# Patient Record
Sex: Female | Born: 1960 | Race: White | Hispanic: No | Marital: Married | Smoking: Former smoker
Health system: Southern US, Community
[De-identification: ages and names within clinical notes are randomized; demographics above are authoritative.]

## PROBLEM LIST (undated history)

## (undated) DIAGNOSIS — H269 Unspecified cataract: Secondary | ICD-10-CM

## (undated) DIAGNOSIS — T4145XA Adverse effect of unspecified anesthetic, initial encounter: Secondary | ICD-10-CM

## (undated) DIAGNOSIS — R42 Dizziness and giddiness: Secondary | ICD-10-CM

## (undated) DIAGNOSIS — T8859XA Other complications of anesthesia, initial encounter: Secondary | ICD-10-CM

## (undated) DIAGNOSIS — G4730 Sleep apnea, unspecified: Secondary | ICD-10-CM

## (undated) DIAGNOSIS — A389 Scarlet fever, uncomplicated: Secondary | ICD-10-CM

## (undated) DIAGNOSIS — D496 Neoplasm of unspecified behavior of brain: Secondary | ICD-10-CM

## (undated) DIAGNOSIS — Z973 Presence of spectacles and contact lenses: Secondary | ICD-10-CM

## (undated) DIAGNOSIS — J189 Pneumonia, unspecified organism: Secondary | ICD-10-CM

## (undated) DIAGNOSIS — Z9289 Personal history of other medical treatment: Secondary | ICD-10-CM

## (undated) DIAGNOSIS — K229 Disease of esophagus, unspecified: Secondary | ICD-10-CM

## (undated) DIAGNOSIS — M797 Fibromyalgia: Secondary | ICD-10-CM

## (undated) DIAGNOSIS — M1990 Unspecified osteoarthritis, unspecified site: Secondary | ICD-10-CM

## (undated) DIAGNOSIS — R519 Headache, unspecified: Secondary | ICD-10-CM

## (undated) HISTORY — PX: CHOLECYSTECTOMY: SHX55

## (undated) HISTORY — PX: ABDOMINAL HYSTERECTOMY: SHX81

## (undated) HISTORY — PX: BARIATRIC SURGERY: SHX1103

## (undated) HISTORY — PX: TONSILLECTOMY: SUR1361

## (undated) HISTORY — PX: TUBAL LIGATION: SHX77

## (undated) HISTORY — PX: JOINT REPLACEMENT: SHX530

## (undated) HISTORY — PX: APPENDECTOMY: SHX54

---
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
## 2017-02-03 DIAGNOSIS — Z9289 Personal history of other medical treatment: Secondary | ICD-10-CM

## 2017-02-03 HISTORY — DX: Personal history of other medical treatment: Z92.89

## 2019-04-26 ENCOUNTER — Other Ambulatory Visit: Payer: Self-pay | Admitting: Neurosurgery

## 2019-04-26 DIAGNOSIS — C719 Malignant neoplasm of brain, unspecified: Secondary | ICD-10-CM

## 2019-04-28 NOTE — Progress Notes (Addendum)
[facility] [place], [place] - [address] Time [place] [address] Phone: [phone] Fax: [phone]    Your procedure is scheduled on Tuesday, July 28th.  Report to [facility] Main Entrance "A" at 10:45 A.M., and check in at the Admitting office.  Call this number if you have problems the morning of surgery:  [phone]  Call [phone] if you have any questions prior to your surgery date Monday-Friday 8am-4pm   Remember:  Do not eat or drink after midnight the night before your surgery    Take these medicines the morning of surgery with A SIP OF WATER docusate sodium (COLACE) gabapentin (NEURONTIN) levETIRAcetam (KEPPRA)  If needed - HYDROcodone-acetaminophen (NORCO)  7 days prior to surgery STOP taking any Aspirin (unless otherwise instructed by your surgeon), Aleve, Naproxen, Ibuprofen, Motrin, Advil, Goody's, BC's, all herbal medications, fish oil, and all vitamins.   The Morning of Surgery  Do not wear jewelry, make-up or nail polish.  Do not wear lotions, powders, or perfumes/colognes, or deodorant  Do not shave 48 hours prior to surgery.   Do not bring valuables to the hospital.  [facility] is not responsible for any belongings or valuables.  If you are a smoker, DO NOT Smoke 24 hours prior to surgery IF you wear a CPAP at night please bring your mask, tubing, and machine the morning of surgery   Remember that you must have someone to transport you home after your surgery, and remain with you for 24 hours if you are discharged the same day.  Contacts, glasses, hearing aids, dentures or bridgework may not be worn into surgery.   Leave your suitcase in the car.  After surgery it may be brought to your room.  For patients admitted to the hospital, discharge time will be determined by your treatment team.  Patients discharged the day of surgery will not be allowed to drive home.   Special instructions:   [facility]- Preparing For Surgery  Before surgery, you can play an important role. Because skin is not sterile, your skin needs to be as free of germs as possible. You can reduce the number of germs on your skin by washing with CHG (chlorahexidine gluconate) Soap before surgery.  CHG is an antiseptic cleaner which kills germs and bonds with the skin to continue killing germs even after washing.    Oral Hygiene is also important to reduce your risk of infection.  Remember - BRUSH YOUR TEETH THE MORNING OF SURGERY WITH YOUR REGULAR TOOTHPASTE  Please do not use if you have an allergy to CHG or antibacterial soaps. If your skin becomes reddened/irritated stop using the CHG.  Do not shave (including legs and underarms) for at least 48 hours prior to first CHG shower. It is OK to shave your face.  Please follow these instructions carefully.   1. Shower the NIGHT BEFORE SURGERY and the MORNING OF SURGERY with CHG Soap.   2. If you chose to wash your hair, wash your hair first as usual with your normal shampoo.  3. After you shampoo, rinse your hair and body thoroughly to remove the shampoo.  4. Use CHG as you would any other liquid soap. You can apply CHG directly to the skin and wash gently with a scrungie or a clean washcloth.   5. Apply the CHG Soap to your body ONLY FROM THE NECK DOWN.  Do not use on open wounds or open sores. Avoid contact with your  eyes, ears, mouth and genitals (private parts). Wash Face and genitals (private parts)  with your normal soap.   6. Wash thoroughly, paying special attention to the area where your surgery will be performed.  7. Thoroughly rinse your body with warm water from the neck down.  8. DO NOT shower/wash with your normal soap after using and rinsing off the CHG Soap.  9. Pat yourself dry with a CLEAN TOWEL.  10. Wear CLEAN PAJAMAS to bed the night before surgery, wear comfortable clothes the morning of surgery  11. Place CLEAN SHEETS on your bed the  night of your first shower and DO NOT SLEEP WITH PETS.  Day of Surgery:  Do not apply any deodorants/lotions. Please shower the morning of surgery with the CHG soap  Please wear clean clothes to the hospital/surgery center.   Remember to brush your teeth WITH YOUR REGULAR TOOTHPASTE.  Please read over the following fact sheets that you were given.

## 2019-04-29 ENCOUNTER — Other Ambulatory Visit (HOSPITAL_COMMUNITY): Payer: Self-pay | Admitting: Neurosurgery

## 2019-04-29 ENCOUNTER — Observation Stay (HOSPITAL_COMMUNITY)
Admission: EM | Admit: 2019-04-29 | Discharge: 2019-04-30 | Disposition: A | Discharge status: Home / Self Care | Payer: Managed Care, Other (non HMO) | Attending: Neurosurgery | Admitting: Neurosurgery

## 2019-04-29 ENCOUNTER — Encounter (HOSPITAL_COMMUNITY): Payer: Self-pay | Admitting: Emergency Medicine

## 2019-04-29 ENCOUNTER — Encounter (HOSPITAL_COMMUNITY): Payer: Self-pay

## 2019-04-29 ENCOUNTER — Inpatient Hospital Stay (HOSPITAL_COMMUNITY): Admission: RE | Admit: 2019-04-29 | Payer: Self-pay | Source: Ambulatory Visit

## 2019-04-29 ENCOUNTER — Emergency Department (HOSPITAL_COMMUNITY): Payer: Managed Care, Other (non HMO)

## 2019-04-29 ENCOUNTER — Other Ambulatory Visit: Payer: Self-pay

## 2019-04-29 ENCOUNTER — Ambulatory Visit (HOSPITAL_COMMUNITY)
Admission: RE | Admit: 2019-04-29 | Discharge: 2019-04-29 | Disposition: A | Discharge status: Home / Self Care | Payer: Managed Care, Other (non HMO) | Source: Ambulatory Visit | Attending: Neurosurgery | Admitting: Neurosurgery

## 2019-04-29 ENCOUNTER — Inpatient Hospital Stay (HOSPITAL_COMMUNITY)
Admission: RE | Admit: 2019-04-29 | Discharge: 2019-04-29 | Disposition: A | Discharge status: Home / Self Care | Payer: Self-pay | Source: Ambulatory Visit

## 2019-04-29 DIAGNOSIS — Z79899 Other long term (current) drug therapy: Secondary | ICD-10-CM | POA: Insufficient documentation

## 2019-04-29 DIAGNOSIS — C719 Malignant neoplasm of brain, unspecified: Secondary | ICD-10-CM | POA: Diagnosis not present

## 2019-04-29 DIAGNOSIS — G9389 Other specified disorders of brain: Secondary | ICD-10-CM

## 2019-04-29 DIAGNOSIS — R51 Headache: Secondary | ICD-10-CM | POA: Insufficient documentation

## 2019-04-29 DIAGNOSIS — I10 Essential (primary) hypertension: Secondary | ICD-10-CM | POA: Insufficient documentation

## 2019-04-29 DIAGNOSIS — R42 Dizziness and giddiness: Secondary | ICD-10-CM | POA: Insufficient documentation

## 2019-04-29 DIAGNOSIS — Z888 Allergy status to other drugs, medicaments and biological substances status: Secondary | ICD-10-CM | POA: Insufficient documentation

## 2019-04-29 DIAGNOSIS — R519 Headache, unspecified: Secondary | ICD-10-CM

## 2019-04-29 DIAGNOSIS — R112 Nausea with vomiting, unspecified: Secondary | ICD-10-CM | POA: Insufficient documentation

## 2019-04-29 DIAGNOSIS — Z1159 Encounter for screening for other viral diseases: Secondary | ICD-10-CM | POA: Insufficient documentation

## 2019-04-29 HISTORY — DX: Neoplasm of unspecified behavior of brain: D49.6

## 2019-04-29 LAB — SARS CORONAVIRUS 2 BY RT PCR (HOSPITAL ORDER, PERFORMED IN ~~LOC~~ HOSPITAL LAB): SARS Coronavirus 2: NEGATIVE

## 2019-04-29 LAB — CBC
HCT: 44 % (ref 36.0–46.0)
Hemoglobin: 14.4 g/dL (ref 12.0–15.0)
MCH: 31.3 pg (ref 26.0–34.0)
MCHC: 32.7 g/dL (ref 30.0–36.0)
MCV: 95.7 fL (ref 80.0–100.0)
Platelets: 311 10*3/uL (ref 150–400)
RBC: 4.6 MIL/uL (ref 3.87–5.11)
RDW: 12 % (ref 11.5–15.5)
WBC: 9.8 10*3/uL (ref 4.0–10.5)
nRBC: 0 % (ref 0.0–0.2)

## 2019-04-29 LAB — BASIC METABOLIC PANEL
Anion gap: 13 (ref 5–15)
BUN: 17 mg/dL (ref 6–20)
CO2: 27 mmol/L (ref 22–32)
Calcium: 9.5 mg/dL (ref 8.9–10.3)
Chloride: 102 mmol/L (ref 98–111)
Creatinine, Ser: 0.71 mg/dL (ref 0.44–1.00)
GFR calc Af Amer: >60 mL/min (ref >60)
GFR calc non Af Amer: >60 mL/min (ref >60)
Glucose, Bld: 113 mg/dL — ABNORMAL HIGH (ref 70–99)
Potassium: 4.2 mmol/L (ref 3.5–5.1)
Sodium: 142 mmol/L (ref 135–145)

## 2019-04-29 MED ORDER — LEVETIRACETAM 500 MG PO TABS
500.0000 mg | ORAL_TABLET | Freq: Two times a day (BID) | ORAL | Status: DC
  Administered 2019-04-30: 500 mg via ORAL
  Filled 2019-04-29 (×2): qty 1

## 2019-04-29 MED ORDER — FAMOTIDINE IN NACL 20-0.9 MG/50ML-% IV SOLN
20.0000 mg | Freq: Once | INTRAVENOUS | Status: AC
  Administered 2019-04-29: 20 mg via INTRAVENOUS
  Filled 2019-04-29: qty 50

## 2019-04-29 MED ORDER — METOCLOPRAMIDE HCL 5 MG/ML IJ SOLN
10.0000 mg | Freq: Once | INTRAMUSCULAR | Status: AC
  Administered 2019-04-29: 10 mg via INTRAVENOUS
  Filled 2019-04-29: qty 2

## 2019-04-29 MED ORDER — OXYBUTYNIN CHLORIDE ER 10 MG PO TB24
10.0000 mg | ORAL_TABLET | Freq: Every day | ORAL | Status: DC
  Filled 2019-04-29: qty 1

## 2019-04-29 MED ORDER — HYDROMORPHONE HCL 1 MG/ML IJ SOLN: 0.5000 mg | INTRAMUSCULAR | Status: DC | PRN

## 2019-04-29 MED ORDER — LEVETIRACETAM IN NACL 500 MG/100ML IV SOLN: 500.0000 mg | Freq: Two times a day (BID) | INTRAVENOUS | Status: DC

## 2019-04-29 MED ORDER — DOCUSATE SODIUM 100 MG PO CAPS
100.0000 mg | ORAL_CAPSULE | Freq: Two times a day (BID) | ORAL | Status: DC
  Administered 2019-04-29: 100 mg via ORAL
  Filled 2019-04-29 (×2): qty 1

## 2019-04-29 MED ORDER — DEXAMETHASONE SODIUM PHOSPHATE 10 MG/ML IJ SOLN
10.0000 mg | Freq: Four times a day (QID) | INTRAMUSCULAR | Status: DC
  Administered 2019-04-30 (×2): 10 mg via INTRAVENOUS
  Filled 2019-04-29 (×3): qty 1

## 2019-04-29 MED ORDER — DEXAMETHASONE 2 MG PO TABS
2.0000 mg | ORAL_TABLET | Freq: Two times a day (BID) | ORAL | Status: DC
  Administered 2019-04-30: 2 mg via ORAL
  Filled 2019-04-29 (×2): qty 1

## 2019-04-29 MED ORDER — GABAPENTIN 300 MG PO CAPS: 300.0000 mg | ORAL_CAPSULE | ORAL | Status: DC

## 2019-04-29 MED ORDER — OXYCODONE HCL 5 MG PO TABS: 5.0000 mg | ORAL_TABLET | Freq: Four times a day (QID) | ORAL | Status: DC | PRN

## 2019-04-29 MED ORDER — PANTOPRAZOLE SODIUM 40 MG IV SOLR
40.0000 mg | Freq: Every day | INTRAVENOUS | Status: DC
  Administered 2019-04-29: 40 mg via INTRAVENOUS
  Filled 2019-04-29: qty 40

## 2019-04-29 MED ORDER — ACETAMINOPHEN 325 MG PO TABS: 650.0000 mg | ORAL_TABLET | ORAL | Status: DC | PRN

## 2019-04-29 MED ORDER — KETOROLAC TROMETHAMINE 15 MG/ML IJ SOLN: 15.0000 mg | Freq: Once | INTRAMUSCULAR | Status: DC

## 2019-04-29 MED ORDER — LEVETIRACETAM 500 MG PO TABS: 500.0000 mg | ORAL_TABLET | Freq: Two times a day (BID) | ORAL | Status: DC

## 2019-04-29 MED ORDER — LEVETIRACETAM IN NACL 500 MG/100ML IV SOLN
500.0000 mg | Freq: Two times a day (BID) | INTRAVENOUS | Status: DC
  Administered 2019-04-30: 500 mg via INTRAVENOUS
  Filled 2019-04-29: qty 100

## 2019-04-29 MED ORDER — ONDANSETRON HCL 4 MG/2ML IJ SOLN: 4.0000 mg | INTRAMUSCULAR | Status: DC | PRN

## 2019-04-29 MED ORDER — BARIATRIC FUSION PO CHEW: CHEWABLE_TABLET | Freq: Two times a day (BID) | ORAL | Status: DC

## 2019-04-29 MED ORDER — ACETAMINOPHEN 650 MG RE SUPP: 650.0000 mg | RECTAL | Status: DC | PRN

## 2019-04-29 MED ORDER — PROMETHAZINE HCL 25 MG PO TABS: 12.5000 mg | ORAL_TABLET | ORAL | Status: DC | PRN

## 2019-04-29 MED ORDER — DEXAMETHASONE SODIUM PHOSPHATE 10 MG/ML IJ SOLN
10.0000 mg | Freq: Once | INTRAMUSCULAR | Status: AC
  Administered 2019-04-29: 10 mg via INTRAVENOUS
  Filled 2019-04-29: qty 1

## 2019-04-29 MED ORDER — LABETALOL HCL 5 MG/ML IV SOLN: 10.0000 mg | INTRAVENOUS | Status: DC | PRN

## 2019-04-29 MED ORDER — ONDANSETRON HCL 4 MG PO TABS
4.0000 mg | ORAL_TABLET | ORAL | Status: DC | PRN
  Administered 2019-04-29: 4 mg via ORAL
  Filled 2019-04-29: qty 1

## 2019-04-29 MED ORDER — HYDROCODONE-ACETAMINOPHEN 7.5-325 MG PO TABS
1.0000 | ORAL_TABLET | Freq: Four times a day (QID) | ORAL | Status: DC | PRN
  Administered 2019-04-30: 1 via ORAL
  Filled 2019-04-29 (×2): qty 1

## 2019-04-29 MED ORDER — GADOBUTROL 1 MMOL/ML IV SOLN
8.0000 mL | Freq: Once | INTRAVENOUS | Status: AC | PRN
  Administered 2019-04-29: 8 mL via INTRAVENOUS

## 2019-04-29 MED ORDER — MORPHINE SULFATE (PF) 4 MG/ML IV SOLN
4.0000 mg | Freq: Once | INTRAVENOUS | Status: AC
  Administered 2019-04-29: 4 mg via INTRAVENOUS
  Filled 2019-04-29: qty 1

## 2019-04-29 MED ORDER — DIPHENHYDRAMINE HCL 50 MG/ML IJ SOLN
12.5000 mg | Freq: Once | INTRAMUSCULAR | Status: AC
  Administered 2019-04-29: 12.5 mg via INTRAVENOUS
  Filled 2019-04-29: qty 1

## 2019-04-29 NOTE — ED Notes (Signed)
ED TO INPATIENT HANDOFF REPORT  ED Nurse Name and Phone #: [phone]  S Name/Age/Gender Vanessa Whitaker 58 y.o. female Room/Bed: 014C/014C  Code Status   Code Status: Full Code  Home/SNF/Other Home Patient oriented to: self, place, time and situation Is this baseline? Yes   Triage Complete: Triage complete  Chief Complaint Nausea, brain tumor  Triage Note PT. STATED, I HAVE A BRAIN TUMOR AND IM SCHEDULED FOR A mri AND IM SO NAUSEATED AND DIZZY.  PT. STATED, IM HAVING REALLY BAD HEAD PAIN.   Allergies Allergies  Allergen Reactions  . Tetanus Toxoids Swelling and Other (See Comments)    Redness and fever @ the site, also    Level of Care/Admitting Diagnosis ED Disposition    ED Disposition Condition [facility] Area: [place] [[id number]]  Level of Care: Progressive [102]  Covid Evaluation: Asymptomatic Screening Protocol (No Symptoms)  Diagnosis: Brain tumor, glioma [facility]) [[id number]]  Admitting Physician: Roxana Hires  Attending Physician: Kary Kos [[id number]]  Estimated length of stay: past midnight tomorrow  Certification:: I certify there are rare and unusual circumstances requiring inpatient admission  Bed request comments: 4NP  PT Class (Do Not Modify): Inpatient [101]  PT Acc Code (Do Not Modify): Private [1]       B Medical/Surgery History Past Medical History:  Diagnosis Date  . Brain tumor [facility])    History reviewed. No pertinent surgical history.   A IV Location/Drains/Wounds Patient Lines/Drains/Airways Status   Active Line/Drains/Airways    Name:   Placement date:   Placement time:   Site:   Days:   Peripheral IV 04/29/19 Right Antecubital   04/29/19    1621    Antecubital   less than 1          Intake/Output Last 24 hours No intake or output data in the 24 hours ending 04/29/19 2318  Labs/Imaging Results for orders placed or performed during the hospital encounter of 04/29/19 (from the past 48 hour(s))   CBC     Status: None   Collection Time: 04/29/19  1:05 PM  Result Value Ref Range   WBC 9.8 4.0 - 10.5 K/uL   RBC 4.60 3.87 - 5.11 MIL/uL   Hemoglobin 14.4 12.0 - 15.0 g/dL   HCT 44.0 36.0 - 46.0 %   MCV 95.7 80.0 - 100.0 fL   MCH 31.3 26.0 - 34.0 pg   MCHC 32.7 30.0 - 36.0 g/dL   RDW 12.0 11.5 - 15.5 %   Platelets 311 150 - 400 K/uL   nRBC 0.0 0.0 - 0.2 %    Comment: Performed at [facility], [place] [address], [place], [place] [address]  Basic metabolic panel     Status: Abnormal   Collection Time: 04/29/19  1:05 PM  Result Value Ref Range   Sodium 142 135 - 145 mmol/L   Potassium 4.2 3.5 - 5.1 mmol/L   Chloride 102 98 - 111 mmol/L   CO2 27 22 - 32 mmol/L   Glucose, Bld 113 (H) 70 - 99 mg/dL   BUN 17 6 - 20 mg/dL   Creatinine, Ser 0.71 0.44 - 1.00 mg/dL   Calcium 9.5 8.9 - 10.3 mg/dL   GFR calc non Af Amer >60 >60 mL/min   GFR calc Af Amer >60 >60 mL/min   Anion gap 13 5 - 15    Comment: Performed at [facility], [place] [address], [place], [place] [address]  Ct Head Wo Contrast  Result Date: 04/29/2019 CLINICAL DATA:  History of brain tumor. Headache acute and severe. Dizziness and nausea. EXAM: CT HEAD WITHOUT CONTRAST TECHNIQUE: Contiguous axial images were obtained from the base of the skull through the vertex without intravenous contrast. COMPARISON:  None. FINDINGS: Brain: Hypoattenuating mass lies in the right frontal lobe measuring approximately 4.7 x 4.2 x 4.1 cm. There is surrounding vasogenic edema and significant mass effect. Mass effect mostly effaces the right lateral and third ventricles and leads to left midline shift of 13 mm. Overlying sulci are effaced. There are no other masses. No evidence of an ischemic infarct. No extra-axial masses or abnormal fluid collections. There is no intracranial hemorrhage. Vascular: No hyperdense vessel or unexpected calcification. Skull: Normal. Negative for fracture or focal lesion. Sinuses/Orbits: Globes and orbits are  unremarkable. Visualized sinuses and mastoid air cells are clear. Other: None IMPRESSION: 1. 4.7 cm right frontal lobe mass with significant associated vasogenic edema and mass effect. 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 2. No evidence of an acute infarct and no intracranial hemorrhage. Electronically Signed   By: Lajean Manes M.D.   On: 04/29/2019 14:50   Mr Jeri Cos ZH Contrast  Result Date: 04/29/2019 CLINICAL DATA:  Right frontal lobe mass. EXAM: MRI HEAD WITHOUT AND WITH CONTRAST TECHNIQUE: Multiplanar, multiecho pulse sequences of the brain and surrounding structures were obtained without and with intravenous contrast. CONTRAST:  8 mL Gadavist COMPARISON:  MRI of the brain without and with contrast at [facility] and CT head without contrast at [facility] 04/29/2019 FINDINGS: Brain: To a peripherally enhancing mass lesion in the right frontal lobe measures 5.8 x 4.5 x 4.2 cm. This results in significant midline shift of up to 12 mm. Diffusion tensor imaging demonstrates medial displacement of white matter tracts. There is peripheral restricted diffusion consistent with tumor. Extensive surrounding vasogenic edema extends through the right external capsule, anterior internal capsule, and throughout the frontal operculum. There is no enhancement of the ventricles. No other discrete areas of enhancement are present. Brainstem and cerebellum are otherwise unremarkable. The right lateral ventricle is effaced. There is susceptibility about the periphery of the lesion. This may be vascular. No acute infarct or hemorrhage is present. Vascular: Flow is present in the major intracranial arteries. Skull and upper cervical spine: The craniocervical junction is normal. Upper cervical spine is within normal limits. Marrow signal is unremarkable. Sinuses/Orbits: The paranasal sinuses and mastoid air cells are clear. The globes and orbits are within normal limits. IMPRESSION: 1. Peripherally  enhancing mass lesion in the anterior right frontal lobe is most consistent with a primary glioma. Metastatic disease is considered less likely. High-grade tumor, GBM is not excluded. 2. Significant mass effect with midline shift measuring up to 12 mm. 3. Face min the right lateral ventricle. 4. Extensive white matter disease extending through the frontal operculum and into the right temporal lobe. Electronically Signed   By: San Morelle M.D.   On: 04/29/2019 18:11    Pending Labs Unresulted Labs (From admission, onward)    Start     Ordered   04/29/19 2141  SARS Coronavirus 2 [facility] order, Performed in [facility] hospital lab)  Once,   R     04/29/19 2141          Vitals/Pain Today's Vitals   04/29/19 2115 04/29/19 2130 04/29/19 2200 04/29/19 2215  BP: 129/69 133/65 128/62 127/68  Pulse: 70 71 80 74  Resp:  Temp:      TempSrc:      SpO2: 95% 96% 96% 95%  Weight:      Height:      PainSc:  4       Isolation Precautions No active isolations  Medications Medications  gabapentin (NEURONTIN) capsule 300-600 mg (has no administration in time range)  HYDROcodone-acetaminophen (NORCO) 7.5-325 MG per tablet 1 tablet (has no administration in time range)  oxybutynin (DITROPAN-XL) 24 hr tablet 10 mg (has no administration in time range)  docusate sodium (COLACE) capsule 100 mg (has no administration in time range)  Bariatric Fusion CHEW (has no administration in time range)  dexamethasone (DECADRON) tablet 2 mg (has no administration in time range)  ondansetron (ZOFRAN) tablet 4 mg (has no administration in time range)    Or  ondansetron (ZOFRAN) injection 4 mg (has no administration in time range)  promethazine (PHENERGAN) tablet 12.5-25 mg (has no administration in time range)  labetalol (NORMODYNE) injection 10-40 mg (has no administration in time range)  pantoprazole (PROTONIX) injection 40 mg (has no administration in time range)  acetaminophen (TYLENOL) tablet  650 mg (has no administration in time range)    Or  acetaminophen (TYLENOL) suppository 650 mg (has no administration in time range)  HYDROmorphone (DILAUDID) injection 0.5-1 mg (has no administration in time range)  oxyCODONE (Oxy IR/ROXICODONE) immediate release tablet 5 mg (has no administration in time range)  dexamethasone (DECADRON) injection 10 mg (has no administration in time range)  levETIRAcetam (KEPPRA) tablet 500 mg (has no administration in time range)    Or  levETIRAcetam (KEPPRA) IVPB 500 mg/100 mL premix (has no administration in time range)  metoCLOPramide (REGLAN) injection 10 mg (10 mg Intravenous Given 04/29/19 1316)  diphenhydrAMINE (BENADRYL) injection 12.5 mg (12.5 mg Intravenous Given 04/29/19 1316)  morphine 4 MG/ML injection 4 mg (4 mg Intravenous Given 04/29/19 1351)  dexamethasone (DECADRON) injection 10 mg (10 mg Intravenous Given 04/29/19 1818)  famotidine (PEPCID) IVPB 20 mg premix (0 mg Intravenous Stopped 04/29/19 2111)  gadobutrol (GADAVIST) 1 MMOL/ML injection 8 mL (8 mLs Intravenous Contrast Given 04/29/19 1756)    Mobility walks Low fall risk   Focused Assessments    R Recommendations: See Admitting Provider Note  Report given to:   Additional Notes:

## 2019-04-29 NOTE — ED Triage Notes (Signed)
PT. STATED, I HAVE A BRAIN TUMOR AND IM SCHEDULED FOR A mri AND IM SO NAUSEATED AND DIZZY.

## 2019-04-29 NOTE — ED Notes (Signed)
Patient transported to MRI 

## 2019-04-29 NOTE — H&P (Signed)
Vanessa Whitaker is an 58 y.o. female.   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  Over the last few days she has been feeling fine just a little disoriented.  She intermittently has weakness in the left side of her face  Past Medical History:  Diagnosis Date  . Brain tumor [facility])     History reviewed. No pertinent surgical history.  No family history on file. Social History:  reports that she has never smoked. She has never used smokeless tobacco. She reports previous alcohol use. She reports previous drug use.  Allergies:  Allergies  Allergen Reactions  . Tetanus Toxoids Swelling and Other (See Comments)    Swelling and redness to the site, fever    (Not in a hospital admission)   Results for orders placed or performed during the hospital encounter of 04/29/19 (from the past 48 hour(s))  CBC     Status: None   Collection Time: 04/29/19  1:05 PM  Result Value Ref Range   WBC 9.8 4.0 - 10.5 K/uL   RBC 4.60 3.87 - 5.11 MIL/uL   Hemoglobin 14.4 12.0 - 15.0 g/dL   HCT 44.0 36.0 - 46.0 %   MCV 95.7 80.0 - 100.0 fL   MCH 31.3 26.0 - 34.0 pg   MCHC 32.7 30.0 - 36.0 g/dL   RDW 12.0 11.5 - 15.5 %   Platelets 311 150 - 400 K/uL   nRBC 0.0 0.0 - 0.2 %    Comment: Performed at [facility], [place] [address], [place], [place] [address]  Basic metabolic panel     Status: Abnormal   Collection Time: 04/29/19  1:05 PM  Result Value Ref Range   Sodium 142 135 - 145 mmol/L   Potassium 4.2 3.5 - 5.1 mmol/L   Chloride 102 98 - 111 mmol/L   CO2 27 22 - 32 mmol/L   Glucose, Bld 113 (H) 70 - 99 mg/dL   BUN 17 6 - 20  mg/dL   Creatinine, Ser 0.71 0.44 - 1.00 mg/dL   Calcium 9.5 8.9 - 10.3 mg/dL   GFR calc non Af Amer >60 >60 mL/min   GFR calc Af Amer >60 >60 mL/min   Anion gap 13 5 - 15    Comment: Performed at [facility], [place] [address] [place], [place] [address]   Ct Head Wo Contrast  Result Date: 04/29/2019 CLINICAL DATA:  History of brain tumor. Headache acute and severe. Dizziness and nausea. EXAM: CT HEAD WITHOUT CONTRAST TECHNIQUE: Contiguous axial images were obtained from the base of the skull through the vertex without intravenous contrast. COMPARISON:  None. FINDINGS: Brain: Hypoattenuating mass lies in the right frontal lobe measuring approximately 4.7 x 4.2 x 4.1 cm. There is surrounding vasogenic edema and significant mass effect. Mass effect mostly effaces the right lateral and third ventricles and leads to left midline shift of 13 mm. Overlying sulci are effaced. There are no other masses. No evidence of an ischemic infarct. No extra-axial masses or abnormal fluid collections. There is no intracranial hemorrhage. Vascular: No hyperdense vessel or unexpected calcification. Skull: Normal. Negative for fracture or focal lesion. Sinuses/Orbits:  Globes and orbits are unremarkable. Visualized sinuses and mastoid air cells are clear. Other: None IMPRESSION: 1. 4.7 cm right frontal lobe mass with significant associated vasogenic edema and mass effect. 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 2. No evidence of an acute infarct and no intracranial hemorrhage. Electronically Signed   By: Lajean Manes M.D.   On: 04/29/2019 14:50    Review of Systems  Gastrointestinal: Positive for nausea and vomiting.  Neurological: Positive for dizziness, focal weakness and headaches.    Blood pressure 127/66, pulse 69, temperature 98.7 F (37.1 C), temperature source Oral, resp. rate 13, height 5' 6.75" (1.695 m), weight 83 kg, SpO2 98 %. Physical Exam  Constitutional: She is oriented to person, place, and  time. She appears well-developed and well-nourished.  HENT:  Head: Normocephalic.  Eyes: Pupils are equal, round, and reactive to light.  Neck: Normal range of motion.  Respiratory: Effort normal.  GI: Soft.  Musculoskeletal: Normal range of motion.  Neurological: She is alert and oriented to person, place, and time. She has normal strength. GCS eye subscore is 4. GCS verbal subscore is 5. GCS motor subscore is 6.  Awake and alert pupils are equal slight partial central 7th nerve palsy on the left strength 5 out of 5 no pronator drift  Skin: Skin is warm and dry.     Assessment/Plan 58 year old female with a known right frontal glioma awaiting craniotomy presents this morning with headaches nausea vomiting ER recommended admission for high-dose IV Decadron and preparation for surgery on Tuesday.  Gavynn Duvall P, MD 04/29/2019, 4:40 PM

## 2019-04-29 NOTE — Discharge Instructions (Addendum)
You have been seen today for headache. Please read and follow all provided instructions.   1. Medications: usual home medications 2. Treatment: rest, drink plenty of fluids 3. Follow Up: Please call and follow up with your neurosurgeon. Please follow up with your primary doctor in 2 days for discussion of your diagnoses and further evaluation after today's visit; if you do not have a primary care doctor use the resource guide provided to find one; Please return to the ER for any new or worsening symptoms. Please obtain all of your results from medical records or have your doctors office obtain the results - share them with your doctor - you should be seen at your doctors office. Call today to arrange your follow up.   Take medications as prescribed. Please review all of the medicines and only take them if you do not have an allergy to them. Return to the emergency room for worsening condition or new concerning symptoms. Follow up with your regular doctor. If you don't have a regular doctor use one of the numbers below to establish a primary care doctor.  Please be aware that if you are taking birth control pills, taking other prescriptions, ESPECIALLY ANTIBIOTICS may make the birth control ineffective - if this is the case, either do not engage in sexual activity or use alternative methods of birth control such as condoms until you have finished the medicine and your family doctor says it is OK to restart them. If you are on a blood thinner such as COUMADIN, be aware that any other medicine that you take may cause the coumadin to either work too much, or not enough - you should have your coumadin level rechecked in next 7 days if this is the case.  ?  It is also a possibility that you have an allergic reaction to any of the medicines that you have been prescribed - Everybody reacts differently to medications and while MOST people have no trouble with most medicines, you may have a reaction such as nausea,  vomiting, rash, swelling, shortness of breath. If this is the case, please stop taking the medicine immediately and contact your physician.  ?  You should return to the ER if you develop severe or worsening symptoms.   Emergency Department Resource Guide 1) Find a Doctor and Pay Out of Pocket Although you won't have to find out who is covered by your insurance plan, it is a good idea to ask around and get recommendations. You will then need to call the office and see if the doctor you have chosen will accept you as a new patient and what types of options they offer for patients who are self-pay. Some doctors offer discounts or will set up payment plans for their patients who do not have insurance, but you will need to ask so you aren't surprised when you get to your appointment.  2) Contact Your Local Health Department Not all health departments have doctors that can see patients for sick visits, but many do, so it is worth a call to see if yours does. If you don't know where your local health department is, you can check in your phone book. The CDC also has a tool to help you locate your state's health department, and many state websites also have listings of all of their local health departments.  3) Find a [facility] If your illness is not likely to be very severe or complicated, you may want to try a walk in  clinic. These are popping up all over the country in pharmacies, drugstores, and shopping centers. They're usually staffed by nurse practitioners or physician assistants that have been trained to treat common illnesses and complaints. They're usually fairly quick and inexpensive. However, if you have serious medical issues or chronic medical problems, these are probably not your best option.  No Primary Care Doctor: Call Health Connect at  [phone] - they can help you locate a primary care doctor that  accepts your insurance, provides certain services, etc. Physician Referral Service-  [phone]  Chronic Pain Problems: Organization         Address  Phone   Notes  [facility]  [phone] Patients need to be referred by their primary care doctor.   Medication Assistance: Organization         Address  Phone   Notes  [facility] Medication [facility] [address] [place], [place] [address] [phone] --Must be a resident of [facility] -- Must have NO insurance coverage whatsoever (no Medicaid/ Medicare, etc.) -- The pt. MUST have a primary care doctor that directs their care regularly and follows them in the community   [facility]  [phone]   [employer]  [phone]    Agencies that provide inexpensive medical care: Organization         Address  Phone   Notes  [place]  [phone]   [facility]    [phone]   [facility] [place], [place] [address] [phone]   [place] [address], [place] [phone]   [facility]    [phone]   [facility]    [phone]   [place] and [place] [address], [place] Phone:  [phone], Fax:  [phone] Hours of Operation:  9 am - 6 pm, M-F.  Also accepts Medicaid/Medicare and self-pay.  [facility] for [address], [address], [place] Phone: [phone], Fax: [phone]. Hours of Operation:  8:30 am - 5:30 pm, M-F.  Also accepts Medicaid and self-pay.  [facility] [address] [place] Phone: [phone]   [place], [place], [place] [phone], [address]. [phone] Mondays & Thursdays: 7-9 AM.  First 15 patients are seen on a first come, first serve basis.    [place] Providers:  Organization         Address  Phone   Notes  [facility] [address],   [phone] Also accepts self-pay patients.  [facility] [address], [place]  [phone]   [place], [address], [place] [phone]   [facility] [address] [place] [phone]   Lucianne Lei [address], [place]   [phone] Only accepts [place] [facility] [place] patients after they have their name applied to their card.   Self-Pay (no insurance) in [facility]:  Organization         Address  Phone   Notes  Sickle Cell Patients, [facility] [place] [phone]   [facility] [place] [phone]   [facility] [place]  [place], [address], [place] [phone]   [facility]/Dr. Osei-Bonsu  [address] [place] or [address], [place] [phone] Phone number for both [place] and [place] locations is the same.  Urgent Medical and [facility] [address] [place] [phone]   [facility] [address] [place] or [address] [phone]   [facility] [address], [place] [phone], phone; [phone], fax Sees patients 1st and 3rd Saturday of every month.  Must not qualify for public or private insurance (i.e. Medicaid, Medicare, [place] Health Choice, Veterans' Benefits)  Household income should be no more than 200% of the poverty level The clinic cannot treat you if you are pregnant or think you are pregnant  Sexually transmitted diseases are not treated at the clinic.

## 2019-04-29 NOTE — ED Provider Notes (Signed)
[facility] EMERGENCY DEPARTMENT Provider Note   CSN: [id number] Arrival date & time: 04/29/19  1240    History   Chief Complaint Chief Complaint  Patient presents with  . Nausea  . Dizziness  . Headache    HPI Vanessa Whitaker is a 58 y.o. female with a PMH of brain mass, HTN, Migraines, Arthritis, and RLS presenting due to a constant right sided throbbing headache onset this morning at 7am. Patient reports nausea and 3 episodes of vomiting. Patient reports associated dizziness that she describes as the room is spinning. Patient states movement makes symptoms worse and nothing makes symptoms better. Patient denies weakness, numbness, vision changes, neck pain, fever, chills, cough, congestion, shortness of breath, or chest pain. 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 Saintclair Halsted. Patient had a CT head and an MRI on 04/19/2019.      HPI  Past Medical History:  Diagnosis Date  . Brain tumor ([place])     There are no active problems to display for this patient.   History reviewed. No pertinent surgical history.   OB History   No obstetric history on file.      Home Medications    Prior to Admission medications   Medication Sig Start Date End Date Taking? Authorizing Provider  dexamethasone (DECADRON) 2 MG tablet Take 2 mg by mouth 2 (two) times daily with a meal. 04/22/19   [provider]  docusate sodium (COLACE) 100 MG capsule Take 100 mg by mouth 2 (two) times daily.    [provider]  gabapentin (NEURONTIN) 300 MG capsule Take 300-600 mg by mouth See admin instructions. Take 300 mg by mouth in the morning and take 600 mg by mouth at bedtime    [provider]  HYDROcodone-acetaminophen (NORCO) 7.5-325 MG tablet Take 1 tablet by mouth every 6 (six) hours as needed for moderate pain.    [provider]  levETIRAcetam (KEPPRA) 500 MG tablet Take 500 mg by mouth 2 (two)  times daily.    [provider]  Multiple Vitamins-Minerals (BARIATRIC FUSION PO) Take 2 tablets by mouth 2 (two) times a day.    [provider]  oxybutynin (DITROPAN-XL) 10 MG 24 hr tablet Take 10 mg by mouth at bedtime.    [provider]    Family History No family history on file.  Social History Social History   Tobacco Use  . Smoking status: Never Smoker  . Smokeless tobacco: Never Used  Substance Use Topics  . Alcohol use: Not Currently  . Drug use: Not Currently     Allergies   Tetanus toxoids  Review of Systems Review of Systems  Constitutional: Negative for activity change, appetite change, chills, diaphoresis, fatigue, fever and unexpected weight change.  HENT: Negative for congestion, ear pain, sinus pressure, sinus pain and sore throat.   Eyes: Negative for photophobia and visual disturbance.  Respiratory: Negative for shortness of breath.   Cardiovascular: Negative for chest pain.  Gastrointestinal: Negative for abdominal pain, nausea and vomiting.  Musculoskeletal: Negative for gait problem, myalgias, neck pain and neck stiffness.  Skin: Negative for rash.  Allergic/Immunologic: Negative for immunocompromised state.  Neurological: Positive for dizziness and headaches. Negative for tremors, seizures, syncope, facial asymmetry, speech difficulty, weakness, light-headedness and numbness.  Hematological: Does not bruise/bleed easily.  Psychiatric/Behavioral: Negative for confusion.    Physical Exam Updated Vital Signs BP 127/66   Pulse 69  Temp 98.7 F (37.1 C) (Oral)   Resp 13   Ht 5' 6.75" (1.695 m)   Wt 83 kg   LMP  (LMP Unknown)   SpO2 98%   BMI 28.88 kg/m   Physical Exam Vitals signs and nursing note reviewed.  Constitutional:      General: She is not in acute distress.    Appearance: She is well-developed. She is not diaphoretic.  HENT:     Head: Normocephalic and atraumatic.     Mouth/Throat:     Mouth: Mucous  membranes are moist.  Eyes:     General: No scleral icterus.    Extraocular Movements: Extraocular movements intact.     Pupils: Pupils are equal, round, and reactive to light. Pupils are equal.  Neck:     Musculoskeletal: Normal range of motion.  Cardiovascular:     Rate and Rhythm: Normal rate and regular rhythm.     Heart sounds: Normal heart sounds. No murmur. No friction rub. No gallop.   Pulmonary:     Effort: Pulmonary effort is normal. No respiratory distress.     Breath sounds: Normal breath sounds. No wheezing or rales.  Abdominal:     Palpations: Abdomen is soft.     Tenderness: There is no abdominal tenderness.  Musculoskeletal: Normal range of motion.  Skin:    General: Skin is warm.     Findings: No erythema or rash.  Neurological:     Mental Status: She is alert.    Mental Status:  Alert, oriented, thought content appropriate, able to give a coherent history. Speech fluent without evidence of aphasia. Able to follow 2 step commands without difficulty.  Cranial Nerves:  II:  Peripheral visual fields grossly normal, pupils equal, round, reactive to light III,IV, VI: ptosis not present, extra-ocular motions intact bilaterally  V,VII: smile symmetric, facial light touch sensation equal VIII: hearing grossly normal to voice  IX,X: symmetric elevation of soft palate, uvula elevates symmetrically  XI: bilateral shoulder shrug symmetric and strong XII: midline tongue extension without fassiculations Motor:  Normal tone. 5/5 in upper and lower extremities bilaterally including strong and equal grip strength and dorsiflexion/plantar flexion Sensory: light touch normal in all extremities.  Deep Tendon Reflexes: 2+ and symmetric in the biceps and patella Cerebellar: normal finger-to-nose with bilateral upper extremities Gait: normal gait and balance.  CV: distal pulses palpable throughout.   ED Treatments / Results  Labs (all labs ordered are listed, but only abnormal  results are displayed) Labs Reviewed  BASIC METABOLIC PANEL - Abnormal; Notable for the following components:      Result Value   Glucose, Bld 113 (*)    All other components within normal limits  CBC    EKG None  Radiology Ct Head Wo Contrast  Result Date: 04/29/2019 CLINICAL DATA:  History of brain tumor. Headache acute and severe. Dizziness and nausea. EXAM: CT HEAD WITHOUT CONTRAST TECHNIQUE: Contiguous axial images were obtained from the base of the skull through the vertex without intravenous contrast. COMPARISON:  None. FINDINGS: Brain: Hypoattenuating mass lies in the right frontal lobe measuring approximately 4.7 x 4.2 x 4.1 cm. There is surrounding vasogenic edema and significant mass effect. Mass effect mostly effaces the right lateral and third ventricles and leads to left midline shift of 13 mm. Overlying sulci are effaced. There are no other masses. No evidence of an ischemic infarct. No extra-axial masses or abnormal fluid collections. There is no intracranial hemorrhage. Vascular: No hyperdense vessel or unexpected calcification.  Skull: Normal. Negative for fracture or focal lesion. Sinuses/Orbits: Globes and orbits are unremarkable. Visualized sinuses and mastoid air cells are clear. Other: None IMPRESSION: 1. 4.7 cm right frontal lobe mass with significant associated vasogenic edema and mass effect. 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 2. No evidence of an acute infarct and no intracranial hemorrhage. Electronically Signed   By: Lajean Manes M.D.   On: 04/29/2019 14:50   Procedures Procedures (including critical care time)  Medications Ordered in ED Medications  metoCLOPramide (REGLAN) injection 10 mg (10 mg Intravenous Given 04/29/19 1316)  diphenhydrAMINE (BENADRYL) injection 12.5 mg (12.5 mg Intravenous Given 04/29/19 1316)  morphine 4 MG/ML injection 4 mg (4 mg Intravenous Given 04/29/19 1351)    Initial Impression / Assessment and Plan / ED Course  I have reviewed  the triage vital signs and the nursing notes.  Pertinent labs & imaging results that were available during my care of the patient were reviewed by me and considered in my medical decision making (see chart for details).  Clinical Course as of Apr 28 1556  Fri Apr 29, 2019  1454 1. 4.7 cm right frontal lobe mass with significant associated vasogenic edema and mass effect. 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 2. No evidence of an acute infarct and no intracranial hemorrhage.    CT Head Wo Contrast [AH]    Clinical Course User Index [AH] Arville Lime, PA-C      Patient presents with a headache. Pt HA treated and slightly improved while in ED. Patient has a history of a brain mass that will require surgery on 07/28. Pt is afebrile with no focal neuro deficits, nuchal rigidity, or change in vision. CT head reveals a 4.7cm right frontal lobe mass with significant associated vasogenic edema and mass effect. Patient had an appointment scheduled with MRI today while she was in the ER. MRI called and suggested MRI be performed while in the ER to help not delay surgery. MRI ordered. Consulted Dr. Saintclair Halsted for further recommendations. Neurosurgery recommends Decadron and anti acid medications. Dr. Saintclair Halsted states he will evaluate patient in the ER. Neurosurgery's evaluation is pending.   Findings and plan of care discussed with supervising physician Dr. Tomi Bamberger.  At shift change care was transferred to Dr. Sabra Heck who will follow pending studies, re-evaluate and determine disposition.    Final Clinical Impressions(s) / ED Diagnoses   Final diagnoses:  Bad headache  Mass of brain    ED Discharge Orders    None       Arville Lime, [place] 04/29/19 1613    Dorie Rank, MD 04/30/19 [phone]

## 2019-04-29 NOTE — ED Notes (Signed)
Patient placed on bedpan.

## 2019-04-29 NOTE — ED Triage Notes (Signed)
PT. STATED, IM HAVING REALLY BAD HEAD PAIN.

## 2019-04-29 NOTE — ED Notes (Signed)
Pt voided and is now off the bedpan.

## 2019-04-29 NOTE — ED Notes (Signed)
Attempted to call report

## 2019-04-30 LAB — SURGICAL PCR SCREEN
MRSA, PCR: NEGATIVE
Staphylococcus aureus: NEGATIVE

## 2019-04-30 MED ORDER — DEXAMETHASONE 4 MG PO TABS: 4.0000 mg | ORAL_TABLET | Freq: Four times a day (QID) | ORAL | 0 refills | Status: DC

## 2019-04-30 MED ORDER — ENSURE MAX PROTEIN PO LIQD
11.0000 [oz_av] | Freq: Every day | ORAL | Status: DC
  Administered 2019-04-30: 11 [oz_av] via ORAL
  Filled 2019-04-30: qty 330

## 2019-04-30 MED ORDER — PANTOPRAZOLE SODIUM 40 MG PO TBEC: 40.0000 mg | DELAYED_RELEASE_TABLET | Freq: Every day | ORAL | Status: DC

## 2019-04-30 NOTE — Discharge Summary (Signed)
Physician Discharge Summary    Providing Compassionate, Quality Care - Together  Patient ID: Vanessa Whitaker MRN: [id number] DOB/AGE: 58-Nov-1962 58 y.o.  Admit date: 04/29/2019 Discharge date: 04/30/2019  Admission Diagnoses: Brain tumor, glioma  Discharge Diagnoses:  Active Problems:   Brain tumor, glioma [facility])   Discharged Condition: good  Hospital Course: Patient was admitted 04/29/2019 for increased headache, nausea, and vomiting. Patient with known right frontal brain tumor. She was scheduled for surgery 05/03/2019. She was treated with decadron during her admission and reports significant improvement of her symptoms. She would like to discharge home and return for surgery on 05/03/2019. She is medically stable at this time and appropriate for discharge home.  Consults: None  Significant Diagnostic Studies: Ct Head Wo Contrast  Result Date: 04/29/2019 CLINICAL DATA:  History of brain tumor. Headache acute and severe. Dizziness and nausea. EXAM: CT HEAD WITHOUT CONTRAST TECHNIQUE: Contiguous axial images were obtained from the base of the skull through the vertex without intravenous contrast. COMPARISON:  None. FINDINGS: Brain: Hypoattenuating mass lies in the right frontal lobe measuring approximately 4.7 x 4.2 x 4.1 cm. There is surrounding vasogenic edema and significant mass effect. Mass effect mostly effaces the right lateral and third ventricles and leads to left midline shift of 13 mm. Overlying sulci are effaced. There are no other masses. No evidence of an ischemic infarct. No extra-axial masses or abnormal fluid collections. There is no intracranial hemorrhage. Vascular: No hyperdense vessel or unexpected calcification. Skull: Normal. Negative for fracture or focal lesion. Sinuses/Orbits: Globes and orbits are unremarkable. Visualized sinuses and mastoid air cells are clear. Other: None IMPRESSION: 1. 4.7 cm right frontal lobe mass with significant associated vasogenic edema and mass  effect. 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 2. No evidence of an acute infarct and no intracranial hemorrhage. Electronically Signed   By: Lajean Manes M.D.   On: 04/29/2019 14:50   Mr Jeri Cos [place] Contrast  Result Date: 04/29/2019 CLINICAL DATA:  Right frontal lobe mass. EXAM: MRI HEAD WITHOUT AND WITH CONTRAST TECHNIQUE: Multiplanar, multiecho pulse sequences of the brain and surrounding structures were obtained without and with intravenous contrast. CONTRAST:  8 mL Gadavist COMPARISON:  MRI of the brain without and with contrast at [facility] and CT head without [facility] 04/29/2019 FINDINGS: Brain: To a peripherally enhancing mass lesion in the right frontal lobe measures 5.8 x 4.5 x 4.2 cm. This results in significant midline shift of up to 12 mm. Diffusion tensor imaging demonstrates medial displacement of white matter tracts. There is peripheral restricted diffusion consistent with tumor. Extensive surrounding vasogenic edema extends through the right external capsule, anterior internal capsule, and throughout the frontal operculum. There is no enhancement of the ventricles. No other discrete areas of enhancement are present. Brainstem and cerebellum are otherwise unremarkable. The right lateral ventricle is effaced. There is susceptibility about the periphery of the lesion. This may be vascular. No acute infarct or hemorrhage is present. Vascular: Flow is present in the major intracranial arteries. Skull and upper cervical spine: The craniocervical junction is normal. Upper cervical spine is within normal limits. Marrow signal is unremarkable. Sinuses/Orbits: The paranasal sinuses and mastoid air cells are clear. The globes and orbits are within normal limits. IMPRESSION: 1. 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 Metastatic disease is considered less likely. High-grade tumor, GBM is not  excluded. 2. Significant mass effect with midline shift measuring  up to 12 mm. 3. Face min the right lateral ventricle. 4. Extensive white matter disease extending through the frontal operculum and into the right temporal lobe. Electronically Signed   By: San Morelle M.D.   On: 04/29/2019 18:11      Treatments: steroids: decadron  Discharge Exam: Blood pressure 122/80, pulse 65, temperature 98.5 F (36.9 C), temperature source Oral, resp. rate 16, height 5' 6.75" (1.695 m), weight 83 kg, SpO2 95 %.   Alert and oriented x 4 PERRLA Speech fluent MAE, Strength 5/5, Normal ROM No pronator drift Slight 7th nerve palsy on the left   Disposition: Discharge disposition: 01-Home or Self Care        Allergies as of 04/30/2019      Reactions   Tetanus Toxoids Swelling, Other (See Comments)   Redness and fever @ the site, also      Medication List    TAKE these medications   BARIATRIC FUSION PO Take 2 tablets by mouth 2 (two) times a day.   dexamethasone 4 MG tablet Commonly known as: DECADRON Take 1 tablet (4 mg total) by mouth every 6 (six) hours. What changed:   medication strength  how much to take  when to take this   diclofenac sodium 1 % Gel Commonly known as: VOLTAREN Place 2-4 g onto the skin 4 (four) times daily as needed (for pain).   docusate sodium 100 MG capsule Commonly known as: COLACE Take 100 mg by mouth 2 (two) times daily.   gabapentin 300 MG capsule Commonly known as: NEURONTIN Take 300-600 mg by mouth See admin instructions. Take 300 mg by mouth in the morning and 600 mg by mouth at bedtime   HYDROcodone-acetaminophen 7.5-325 MG tablet Commonly known as: NORCO Take 1 tablet by mouth every 6 (six) hours as needed for moderate pain.   levETIRAcetam 500 MG tablet Commonly known as: KEPPRA Take 500 mg by mouth 2 (two) times daily.   meclizine 12.5 MG tablet Commonly known as: ANTIVERT Take 12.5-25 mg by mouth every 6 (six) hours as  needed for dizziness or nausea.   oxybutynin 10 MG 24 hr tablet Commonly known as: DITROPAN-XL Take 10 mg by mouth at bedtime.      Follow-up Information    Kary Kos, MD Follow up.   Specialty: Neurosurgery Why: Surgery scheduled for Tuesday, 05/03/2019 Contact information: [address] [place] [address] [place] [address] [phone]           Signed: Patricia Nettle 04/30/2019, 10:52 AM

## 2019-05-02 ENCOUNTER — Other Ambulatory Visit: Payer: Self-pay

## 2019-05-02 ENCOUNTER — Encounter (HOSPITAL_COMMUNITY): Payer: Self-pay | Admitting: *Deleted

## 2019-05-02 NOTE — Progress Notes (Signed)
Pt denies SOB, chest pain, and being under the care of a cardiologist. Pt stated that her PCP is Lorenso Quarry, NP.  Pt denies having an echo and cardiac cath. Chest x ray and LOV note requested from [facility] Coordinator, made nurse aware that there is no record of a chest x ray being performed or LOV note. Nurse requested stress test and EKG tracing from [facility]; awaiting response. Pt stated that she does not take Aspirin. Pt made aware to stop taking  vitamins, fish oil and herbal medications. Do not take any NSAIDs ie: Ibuprofen, Advil, Naproxen (Aleve), Motrin, Voltaren Gel, BC and Goody Powder. Pt denies that she and family members tested positive for COVID-19 ( pt tested on 04/30/19; pt reminded to quarantine).   Coronavirus Screening  Pt denies that she and family experienced the following symptoms:  Cough yes/no: No Fever (>100.59F)  yes/no: No Runny nose yes/no: No Sore throat yes/no: No Difficulty breathing/shortness of breath  yes/no: No  Have you or a family member traveled in the last 14 days and where? yes/no: No  Pt reminded that hospital visitation restrictions are in effect and the importance of the restrictions.   Pt verbalized understanding of all pre-op instructions.

## 2019-05-03 ENCOUNTER — Encounter (HOSPITAL_COMMUNITY): Payer: Self-pay | Admitting: *Deleted

## 2019-05-03 ENCOUNTER — Inpatient Hospital Stay (HOSPITAL_COMMUNITY): Payer: Managed Care, Other (non HMO) | Admitting: Certified Registered Nurse Anesthetist

## 2019-05-03 ENCOUNTER — Inpatient Hospital Stay (HOSPITAL_COMMUNITY)
Admission: RE | Admit: 2019-05-03 | Discharge: 2019-05-07 | DRG: 027 | Disposition: A | Discharge status: Home / Self Care | Payer: Managed Care, Other (non HMO) | Attending: Neurosurgery | Admitting: Neurosurgery

## 2019-05-03 ENCOUNTER — Encounter (HOSPITAL_COMMUNITY)
Admission: RE | Disposition: A | Discharge status: Home / Self Care | Payer: Self-pay | Source: Home / Self Care | Attending: Neurosurgery

## 2019-05-03 ENCOUNTER — Encounter (HOSPITAL_COMMUNITY): Payer: Self-pay

## 2019-05-03 ENCOUNTER — Other Ambulatory Visit: Payer: Self-pay

## 2019-05-03 ENCOUNTER — Ambulatory Visit (HOSPITAL_COMMUNITY)
Admission: RE | Admit: 2019-05-03 | Discharge: 2019-05-03 | Disposition: A | Discharge status: Home / Self Care | Payer: Managed Care, Other (non HMO) | Source: Ambulatory Visit | Attending: Neurosurgery | Admitting: Neurosurgery

## 2019-05-03 DIAGNOSIS — C719 Malignant neoplasm of brain, unspecified: Secondary | ICD-10-CM

## 2019-05-03 DIAGNOSIS — M797 Fibromyalgia: Secondary | ICD-10-CM | POA: Diagnosis present

## 2019-05-03 DIAGNOSIS — Z8249 Family history of ischemic heart disease and other diseases of the circulatory system: Secondary | ICD-10-CM | POA: Diagnosis not present

## 2019-05-03 DIAGNOSIS — Z823 Family history of stroke: Secondary | ICD-10-CM

## 2019-05-03 DIAGNOSIS — C711 Malignant neoplasm of frontal lobe: Principal | ICD-10-CM | POA: Diagnosis present

## 2019-05-03 DIAGNOSIS — H269 Unspecified cataract: Secondary | ICD-10-CM | POA: Diagnosis present

## 2019-05-03 DIAGNOSIS — G4730 Sleep apnea, unspecified: Secondary | ICD-10-CM | POA: Diagnosis present

## 2019-05-03 DIAGNOSIS — R29810 Facial weakness: Secondary | ICD-10-CM | POA: Diagnosis present

## 2019-05-03 DIAGNOSIS — Z87891 Personal history of nicotine dependence: Secondary | ICD-10-CM

## 2019-05-03 DIAGNOSIS — Z801 Family history of malignant neoplasm of trachea, bronchus and lung: Secondary | ICD-10-CM | POA: Diagnosis not present

## 2019-05-03 DIAGNOSIS — Z79899 Other long term (current) drug therapy: Secondary | ICD-10-CM | POA: Diagnosis not present

## 2019-05-03 DIAGNOSIS — Z96643 Presence of artificial hip joint, bilateral: Secondary | ICD-10-CM | POA: Diagnosis present

## 2019-05-03 DIAGNOSIS — M1990 Unspecified osteoarthritis, unspecified site: Secondary | ICD-10-CM | POA: Diagnosis present

## 2019-05-03 DIAGNOSIS — Z9889 Other specified postprocedural states: Secondary | ICD-10-CM

## 2019-05-03 DIAGNOSIS — Z9884 Bariatric surgery status: Secondary | ICD-10-CM | POA: Diagnosis not present

## 2019-05-03 DIAGNOSIS — Z887 Allergy status to serum and vaccine status: Secondary | ICD-10-CM | POA: Diagnosis not present

## 2019-05-03 HISTORY — DX: Dizziness and giddiness: R42

## 2019-05-03 HISTORY — DX: Scarlet fever, uncomplicated: A38.9

## 2019-05-03 HISTORY — DX: Other complications of anesthesia, initial encounter: T88.59XA

## 2019-05-03 HISTORY — DX: Unspecified osteoarthritis, unspecified site: M19.90

## 2019-05-03 HISTORY — DX: Fibromyalgia: M79.7

## 2019-05-03 HISTORY — DX: Pneumonia, unspecified organism: J18.9

## 2019-05-03 HISTORY — DX: Disease of esophagus, unspecified: K22.9

## 2019-05-03 HISTORY — PX: CRANIOTOMY: SHX93

## 2019-05-03 HISTORY — DX: Headache, unspecified: R51.9

## 2019-05-03 HISTORY — DX: Unspecified cataract: H26.9

## 2019-05-03 HISTORY — DX: Presence of spectacles and contact lenses: Z97.3

## 2019-05-03 HISTORY — DX: Sleep apnea, unspecified: G47.30

## 2019-05-03 HISTORY — PX: APPLICATION OF CRANIAL NAVIGATION: SHX6578

## 2019-05-03 LAB — BASIC METABOLIC PANEL
Anion gap: 10 (ref 5–15)
BUN: 13 mg/dL (ref 6–20)
CO2: 25 mmol/L (ref 22–32)
Calcium: 8.4 mg/dL — ABNORMAL LOW (ref 8.9–10.3)
Chloride: 106 mmol/L (ref 98–111)
Creatinine, Ser: 0.61 mg/dL (ref 0.44–1.00)
GFR calc Af Amer: >60 mL/min (ref >60)
GFR calc non Af Amer: >60 mL/min (ref >60)
Glucose, Bld: 165 mg/dL — ABNORMAL HIGH (ref 70–99)
Potassium: 3.4 mmol/L — ABNORMAL LOW (ref 3.5–5.1)
Sodium: 141 mmol/L (ref 135–145)

## 2019-05-03 LAB — ABO/RH: ABO/RH(D): O POS

## 2019-05-03 LAB — TYPE AND SCREEN
ABO/RH(D): O POS
Antibody Screen: NEGATIVE

## 2019-05-03 LAB — CBC
HCT: 38.3 % (ref 36.0–46.0)
Hemoglobin: 12.6 g/dL (ref 12.0–15.0)
MCH: 31.5 pg (ref 26.0–34.0)
MCHC: 32.9 g/dL (ref 30.0–36.0)
MCV: 95.8 fL (ref 80.0–100.0)
Platelets: 238 10*3/uL (ref 150–400)
RBC: 4 MIL/uL (ref 3.87–5.11)
RDW: 12.2 % (ref 11.5–15.5)
WBC: 8.8 10*3/uL (ref 4.0–10.5)
nRBC: 0 % (ref 0.0–0.2)

## 2019-05-03 LAB — POCT I-STAT 4, (NA,K, GLUC, HGB,HCT)
Glucose, Bld: 121 mg/dL — ABNORMAL HIGH (ref 70–99)
HCT: 37 % (ref 36.0–46.0)
Hemoglobin: 12.6 g/dL (ref 12.0–15.0)
Potassium: 3.7 mmol/L (ref 3.5–5.1)
Sodium: 142 mmol/L (ref 135–145)

## 2019-05-03 LAB — MRSA PCR SCREENING: MRSA by PCR: NEGATIVE

## 2019-05-03 SURGERY — CRANIOTOMY TUMOR EXCISION
Anesthesia: General | Site: Head | Laterality: Right

## 2019-05-03 MED ORDER — ESMOLOL HCL 100 MG/10ML IV SOLN
INTRAVENOUS | Status: AC
  Filled 2019-05-03: qty 20

## 2019-05-03 MED ORDER — SODIUM CHLORIDE 0.9 % IV SOLN
INTRAVENOUS | Status: DC | PRN
  Administered 2019-05-03: 500 mg via INTRAVENOUS

## 2019-05-03 MED ORDER — ONDANSETRON HCL 4 MG/2ML IJ SOLN
INTRAMUSCULAR | Status: DC | PRN
  Administered 2019-05-03: 4 mg via INTRAVENOUS

## 2019-05-03 MED ORDER — DEXAMETHASONE SODIUM PHOSPHATE 10 MG/ML IJ SOLN
10.0000 mg | Freq: Once | INTRAMUSCULAR | Status: AC
  Administered 2019-05-03: 10 mg via INTRAVENOUS
  Filled 2019-05-03: qty 1

## 2019-05-03 MED ORDER — ACETAMINOPHEN 500 MG PO TABS
1000.0000 mg | ORAL_TABLET | Freq: Once | ORAL | Status: AC
  Administered 2019-05-03: 1000 mg via ORAL
  Filled 2019-05-03: qty 2

## 2019-05-03 MED ORDER — LEVETIRACETAM 500 MG PO TABS: 500.0000 mg | ORAL_TABLET | Freq: Two times a day (BID) | ORAL | Status: DC

## 2019-05-03 MED ORDER — LABETALOL HCL 5 MG/ML IV SOLN
INTRAVENOUS | Status: AC
  Filled 2019-05-03: qty 4

## 2019-05-03 MED ORDER — DEXAMETHASONE 4 MG PO TABS
4.0000 mg | ORAL_TABLET | Freq: Four times a day (QID) | ORAL | Status: DC
  Filled 2019-05-03: qty 1

## 2019-05-03 MED ORDER — HYDROMORPHONE HCL 1 MG/ML IJ SOLN
0.5000 mg | INTRAMUSCULAR | Status: DC | PRN
  Administered 2019-05-03: 1 mg via INTRAVENOUS
  Administered 2019-05-03: 0.5 mg via INTRAVENOUS
  Administered 2019-05-04 – 2019-05-06 (×17): 1 mg via INTRAVENOUS
  Filled 2019-05-03 (×18): qty 1

## 2019-05-03 MED ORDER — BACITRACIN ZINC 500 UNIT/GM EX OINT
TOPICAL_OINTMENT | CUTANEOUS | Status: DC | PRN
  Administered 2019-05-03 (×2): 1 via TOPICAL

## 2019-05-03 MED ORDER — SODIUM CHLORIDE 0.9 % IV SOLN
INTRAVENOUS | Status: DC
  Administered 2019-05-03: 14:00:00 via INTRAVENOUS
  Administered 2019-05-03: 1000 mL via INTRAVENOUS
  Administered 2019-05-05: 21:00:00 via INTRAVENOUS

## 2019-05-03 MED ORDER — THROMBIN 5000 UNITS EX SOLR
OROMUCOSAL | Status: DC | PRN
  Administered 2019-05-03: 5 mL via TOPICAL

## 2019-05-03 MED ORDER — HEMOSTATIC AGENTS (NO CHARGE) OPTIME
TOPICAL | Status: DC | PRN
  Administered 2019-05-03 (×2): 1 via TOPICAL

## 2019-05-03 MED ORDER — HYDROMORPHONE HCL 1 MG/ML IJ SOLN
INTRAMUSCULAR | Status: AC
  Filled 2019-05-03: qty 1

## 2019-05-03 MED ORDER — POTASSIUM CHLORIDE IN NACL 20-0.9 MEQ/L-% IV SOLN
INTRAVENOUS | Status: DC
  Administered 2019-05-03 – 2019-05-04 (×2): via INTRAVENOUS
  Filled 2019-05-03 (×3): qty 1000

## 2019-05-03 MED ORDER — ROCURONIUM BROMIDE 10 MG/ML (PF) SYRINGE
PREFILLED_SYRINGE | INTRAVENOUS | Status: DC | PRN
  Administered 2019-05-03: 20 mg via INTRAVENOUS
  Administered 2019-05-03: 50 mg via INTRAVENOUS
  Administered 2019-05-03: 30 mg via INTRAVENOUS
  Administered 2019-05-03: 50 mg via INTRAVENOUS
  Administered 2019-05-03: 20 mg via INTRAVENOUS
  Administered 2019-05-03: 10 mg via INTRAVENOUS
  Administered 2019-05-03 (×2): 20 mg via INTRAVENOUS

## 2019-05-03 MED ORDER — ONDANSETRON HCL 4 MG/2ML IJ SOLN: 4.0000 mg | INTRAMUSCULAR | Status: DC | PRN

## 2019-05-03 MED ORDER — LIDOCAINE 2% (20 MG/ML) 5 ML SYRINGE
INTRAMUSCULAR | Status: DC | PRN
  Administered 2019-05-03: 60 mg via INTRAVENOUS

## 2019-05-03 MED ORDER — CEFAZOLIN SODIUM-DEXTROSE 2-4 GM/100ML-% IV SOLN
INTRAVENOUS | Status: AC
  Filled 2019-05-03: qty 100

## 2019-05-03 MED ORDER — DOCUSATE SODIUM 100 MG PO CAPS: 100.0000 mg | ORAL_CAPSULE | Freq: Two times a day (BID) | ORAL | Status: DC

## 2019-05-03 MED ORDER — OXYCODONE HCL 5 MG PO TABS
ORAL_TABLET | ORAL | Status: AC
  Filled 2019-05-03: qty 1

## 2019-05-03 MED ORDER — 0.9 % SODIUM CHLORIDE (POUR BTL) OPTIME
TOPICAL | Status: DC | PRN
  Administered 2019-05-03 (×3): 1000 mL

## 2019-05-03 MED ORDER — ONDANSETRON HCL 4 MG/2ML IJ SOLN
INTRAMUSCULAR | Status: AC
  Filled 2019-05-03: qty 2

## 2019-05-03 MED ORDER — CHLORHEXIDINE GLUCONATE CLOTH 2 % EX PADS: 6.0000 | MEDICATED_PAD | Freq: Once | CUTANEOUS | Status: DC

## 2019-05-03 MED ORDER — FENTANYL CITRATE (PF) 250 MCG/5ML IJ SOLN
INTRAMUSCULAR | Status: AC
  Filled 2019-05-03: qty 5

## 2019-05-03 MED ORDER — CEFAZOLIN SODIUM-DEXTROSE 2-4 GM/100ML-% IV SOLN
2.0000 g | INTRAVENOUS | Status: AC
  Administered 2019-05-03 (×2): 2 g via INTRAVENOUS
  Filled 2019-05-03: qty 100

## 2019-05-03 MED ORDER — PROMETHAZINE HCL 25 MG PO TABS: 12.5000 mg | ORAL_TABLET | ORAL | Status: DC | PRN

## 2019-05-03 MED ORDER — DEXAMETHASONE SODIUM PHOSPHATE 10 MG/ML IJ SOLN
10.0000 mg | Freq: Four times a day (QID) | INTRAMUSCULAR | Status: DC
  Administered 2019-05-03 – 2019-05-04 (×3): 10 mg via INTRAVENOUS
  Filled 2019-05-03 (×3): qty 1

## 2019-05-03 MED ORDER — ROCURONIUM BROMIDE 10 MG/ML (PF) SYRINGE
PREFILLED_SYRINGE | INTRAVENOUS | Status: AC
  Filled 2019-05-03: qty 10

## 2019-05-03 MED ORDER — DEXAMETHASONE SODIUM PHOSPHATE 10 MG/ML IJ SOLN
INTRAMUSCULAR | Status: AC
  Filled 2019-05-03: qty 1

## 2019-05-03 MED ORDER — SODIUM CHLORIDE 0.9 % IV SOLN
INTRAVENOUS | Status: DC | PRN
  Administered 2019-05-03: 13:00:00 via INTRAVENOUS

## 2019-05-03 MED ORDER — DOCUSATE SODIUM 100 MG PO CAPS
100.0000 mg | ORAL_CAPSULE | Freq: Two times a day (BID) | ORAL | Status: DC
  Administered 2019-05-03 – 2019-05-07 (×8): 100 mg via ORAL
  Filled 2019-05-03 (×8): qty 1

## 2019-05-03 MED ORDER — OXYCODONE HCL 5 MG/5ML PO SOLN: 5.0000 mg | Freq: Once | ORAL | Status: AC | PRN

## 2019-05-03 MED ORDER — GABAPENTIN 300 MG PO CAPS
300.0000 mg | ORAL_CAPSULE | Freq: Every day | ORAL | Status: DC
  Administered 2019-05-04 – 2019-05-07 (×4): 300 mg via ORAL
  Filled 2019-05-03 (×4): qty 1

## 2019-05-03 MED ORDER — THROMBIN 20000 UNITS EX SOLR
CUTANEOUS | Status: AC
  Filled 2019-05-03: qty 20000

## 2019-05-03 MED ORDER — LABETALOL HCL 5 MG/ML IV SOLN
10.0000 mg | INTRAVENOUS | Status: DC | PRN
  Administered 2019-05-03: 10 mg via INTRAVENOUS
  Filled 2019-05-03: qty 8

## 2019-05-03 MED ORDER — LIDOCAINE-EPINEPHRINE 1 %-1:100000 IJ SOLN
INTRAMUSCULAR | Status: DC | PRN
  Administered 2019-05-03: 9 mL

## 2019-05-03 MED ORDER — MIDAZOLAM HCL 2 MG/2ML IJ SOLN
INTRAMUSCULAR | Status: AC
  Filled 2019-05-03: qty 2

## 2019-05-03 MED ORDER — LEVETIRACETAM IN NACL 500 MG/100ML IV SOLN
500.0000 mg | Freq: Two times a day (BID) | INTRAVENOUS | Status: DC
  Administered 2019-05-03 – 2019-05-06 (×6): 500 mg via INTRAVENOUS
  Filled 2019-05-03 (×5): qty 100

## 2019-05-03 MED ORDER — HYDROCODONE-ACETAMINOPHEN 5-325 MG PO TABS
1.0000 | ORAL_TABLET | ORAL | Status: DC | PRN
  Administered 2019-05-03 – 2019-05-06 (×9): 1 via ORAL
  Filled 2019-05-03 (×9): qty 1

## 2019-05-03 MED ORDER — GABAPENTIN 300 MG PO CAPS: 300.0000 mg | ORAL_CAPSULE | ORAL | Status: DC

## 2019-05-03 MED ORDER — OXYBUTYNIN CHLORIDE ER 10 MG PO TB24
10.0000 mg | ORAL_TABLET | Freq: Every day | ORAL | Status: DC
  Administered 2019-05-03 – 2019-05-06 (×4): 10 mg via ORAL
  Filled 2019-05-03 (×7): qty 1

## 2019-05-03 MED ORDER — THROMBIN 5000 UNITS EX SOLR
CUTANEOUS | Status: AC
  Filled 2019-05-03: qty 10000

## 2019-05-03 MED ORDER — THROMBIN 20000 UNITS EX SOLR
CUTANEOUS | Status: DC | PRN
  Administered 2019-05-03: 20 mL via TOPICAL

## 2019-05-03 MED ORDER — LEVETIRACETAM IN NACL 500 MG/100ML IV SOLN
500.0000 mg | INTRAVENOUS | Status: AC
  Filled 2019-05-03 (×2): qty 100

## 2019-05-03 MED ORDER — HYDROMORPHONE HCL 1 MG/ML IJ SOLN
0.2500 mg | INTRAMUSCULAR | Status: DC | PRN
  Administered 2019-05-03 (×4): 0.5 mg via INTRAVENOUS

## 2019-05-03 MED ORDER — HYDROCODONE-ACETAMINOPHEN 7.5-325 MG PO TABS: 1.0000 | ORAL_TABLET | Freq: Four times a day (QID) | ORAL | Status: DC | PRN

## 2019-05-03 MED ORDER — HYDRALAZINE HCL 20 MG/ML IJ SOLN
10.0000 mg | INTRAMUSCULAR | Status: DC | PRN
  Administered 2019-05-03 (×2): 10 mg via INTRAVENOUS
  Administered 2019-05-04 (×2): 20 mg via INTRAVENOUS
  Filled 2019-05-03 (×3): qty 1

## 2019-05-03 MED ORDER — SENNOSIDES-DOCUSATE SODIUM 8.6-50 MG PO TABS: 1.0000 | ORAL_TABLET | Freq: Every evening | ORAL | Status: DC | PRN

## 2019-05-03 MED ORDER — ONDANSETRON HCL 4 MG PO TABS: 4.0000 mg | ORAL_TABLET | ORAL | Status: DC | PRN

## 2019-05-03 MED ORDER — LIDOCAINE-EPINEPHRINE 1 %-1:100000 IJ SOLN
INTRAMUSCULAR | Status: AC
  Filled 2019-05-03: qty 1

## 2019-05-03 MED ORDER — ADULT MULTIVITAMIN W/MINERALS CH
1.0000 | ORAL_TABLET | Freq: Two times a day (BID) | ORAL | Status: DC
  Administered 2019-05-03 – 2019-05-07 (×8): 1 via ORAL
  Filled 2019-05-03 (×8): qty 1

## 2019-05-03 MED ORDER — GABAPENTIN 300 MG PO CAPS
600.0000 mg | ORAL_CAPSULE | Freq: Every day | ORAL | Status: DC
  Administered 2019-05-03 – 2019-05-06 (×4): 600 mg via ORAL
  Filled 2019-05-03 (×4): qty 2

## 2019-05-03 MED ORDER — PROMETHAZINE HCL 25 MG/ML IJ SOLN: 6.2500 mg | INTRAMUSCULAR | Status: DC | PRN

## 2019-05-03 MED ORDER — SODIUM CHLORIDE 0.9 % IV SOLN
INTRAVENOUS | Status: DC | PRN
  Administered 2019-05-03: 20 ug/min via INTRAVENOUS

## 2019-05-03 MED ORDER — MECLIZINE HCL 12.5 MG PO TABS
12.5000 mg | ORAL_TABLET | Freq: Four times a day (QID) | ORAL | Status: DC | PRN
  Filled 2019-05-03: qty 2

## 2019-05-03 MED ORDER — PROPOFOL 10 MG/ML IV BOLUS
INTRAVENOUS | Status: DC | PRN
  Administered 2019-05-03: 50 mg via INTRAVENOUS
  Administered 2019-05-03: 150 mg via INTRAVENOUS

## 2019-05-03 MED ORDER — CEFAZOLIN SODIUM-DEXTROSE 2-4 GM/100ML-% IV SOLN
2.0000 g | Freq: Three times a day (TID) | INTRAVENOUS | Status: AC
  Administered 2019-05-04 (×2): 2 g via INTRAVENOUS
  Filled 2019-05-03 (×2): qty 100

## 2019-05-03 MED ORDER — FENTANYL CITRATE (PF) 100 MCG/2ML IJ SOLN
INTRAMUSCULAR | Status: DC | PRN
  Administered 2019-05-03: 150 ug via INTRAVENOUS
  Administered 2019-05-03 (×2): 50 ug via INTRAVENOUS

## 2019-05-03 MED ORDER — BACITRACIN ZINC 500 UNIT/GM EX OINT
TOPICAL_OINTMENT | CUTANEOUS | Status: AC
  Filled 2019-05-03: qty 28.35

## 2019-05-03 MED ORDER — SODIUM CHLORIDE 0.9 % IV SOLN
INTRAVENOUS | Status: DC | PRN
  Administered 2019-05-03: 500 mL

## 2019-05-03 MED ORDER — PANTOPRAZOLE SODIUM 40 MG IV SOLR
40.0000 mg | Freq: Every day | INTRAVENOUS | Status: DC
  Administered 2019-05-03 – 2019-05-04 (×2): 40 mg via INTRAVENOUS
  Filled 2019-05-03 (×2): qty 40

## 2019-05-03 MED ORDER — SUGAMMADEX SODIUM 500 MG/5ML IV SOLN
INTRAVENOUS | Status: DC | PRN
  Administered 2019-05-03: 200 mg via INTRAVENOUS

## 2019-05-03 MED ORDER — OXYCODONE HCL 5 MG PO TABS
5.0000 mg | ORAL_TABLET | Freq: Once | ORAL | Status: AC | PRN
  Administered 2019-05-03: 5 mg via ORAL

## 2019-05-03 SURGICAL SUPPLY — 85 items
BAG DECANTER FOR FLEXI CONT (MISCELLANEOUS) ×3 IMPLANT
BLADE CLIPPER SURG (BLADE) ×4 IMPLANT
BLADE SURG 11 STRL SS (BLADE) ×3 IMPLANT
BNDG COHESIVE 4X5 TAN NS LF (GAUZE/BANDAGES/DRESSINGS) ×2 IMPLANT
BNDG GAUZE ELAST 4 BULKY (GAUZE/BANDAGES/DRESSINGS) ×1 IMPLANT
BNDG STRETCH 4X75 NS LF (GAUZE/BANDAGES/DRESSINGS) ×1 IMPLANT
BNDG STRETCH 4X75 STRL LF (GAUZE/BANDAGES/DRESSINGS) ×2 IMPLANT
BUR ACORN 9.0 PRECISION (BURR) ×3 IMPLANT
BUR SPIRAL ROUTER 2.3 (BUR) ×1 IMPLANT
CANISTER SUCT 3000ML PPV (MISCELLANEOUS) ×6 IMPLANT
CARTRIDGE OIL MAESTRO DRILL (MISCELLANEOUS) ×2 IMPLANT
CLIP VESOCCLUDE MED 6/CT (CLIP) ×2 IMPLANT
CONT SPEC 4OZ CLIKSEAL STRL BL (MISCELLANEOUS) ×4 IMPLANT
COVER BACK TABLE 60X90IN (DRAPES) ×2 IMPLANT
COVER WAND RF STERILE (DRAPES) ×2 IMPLANT
DECANTER SPIKE VIAL GLASS SM (MISCELLANEOUS) ×3 IMPLANT
DERMABOND ADVANCED (GAUZE/BANDAGES/DRESSINGS) ×1
DERMABOND ADVANCED .7 DNX12 (GAUZE/BANDAGES/DRESSINGS) ×2 IMPLANT
DIFFUSER DRILL AIR PNEUMATIC (MISCELLANEOUS) ×3 IMPLANT
DRAPE CAMERA VIDEO/LASER (DRAPES) IMPLANT
DRAPE MICROSCOPE LEICA (MISCELLANEOUS) ×1 IMPLANT
DRAPE NEUROLOGICAL W/INCISE (DRAPES) ×1 IMPLANT
DRAPE STERI IOBAN 125X83 (DRAPES) IMPLANT
DRAPE WARM FLUID 44X44 (DRAPES) ×3 IMPLANT
DRSG OPSITE 4X5.5 SM (GAUZE/BANDAGES/DRESSINGS) ×2 IMPLANT
ELECT REM PT RETURN 9FT ADLT (ELECTROSURGICAL) ×3
ELECTRODE REM PT RTRN 9FT ADLT (ELECTROSURGICAL) ×2 IMPLANT
FORCEPS BIPOLAR SPETZLER 8 1.0 (NEUROSURGERY SUPPLIES) ×1 IMPLANT
GAUZE 4X4 16PLY RFD (DISPOSABLE) IMPLANT
GAUZE SPONGE 4X4 12PLY STRL (GAUZE/BANDAGES/DRESSINGS) ×3 IMPLANT
GLOVE BIO SURGEON STRL SZ 6.5 (GLOVE) IMPLANT
GLOVE BIO SURGEON STRL SZ7 (GLOVE) ×1 IMPLANT
GLOVE BIO SURGEON STRL SZ8 (GLOVE) ×3 IMPLANT
GLOVE BIOGEL PI IND STRL 7.0 (GLOVE) IMPLANT
GLOVE BIOGEL PI IND STRL 7.5 (GLOVE) IMPLANT
GLOVE BIOGEL PI IND STRL 8 (GLOVE) IMPLANT
GLOVE BIOGEL PI INDICATOR 7.0 (GLOVE) ×1
GLOVE BIOGEL PI INDICATOR 7.5 (GLOVE) ×2
GLOVE BIOGEL PI INDICATOR 8 (GLOVE) ×2
GLOVE ECLIPSE 7.5 STRL STRAW (GLOVE) ×3 IMPLANT
GLOVE EXAM NITRILE XL STR (GLOVE) IMPLANT
GLOVE INDICATOR 6.5 STRL GRN (GLOVE) ×1 IMPLANT
GLOVE INDICATOR 8.5 STRL (GLOVE) ×3 IMPLANT
GLOVE SURG SS PI 6.0 STRL IVOR (GLOVE) ×2 IMPLANT
GOWN STRL REUS W/ TWL LRG LVL3 (GOWN DISPOSABLE) ×2 IMPLANT
GOWN STRL REUS W/ TWL XL LVL3 (GOWN DISPOSABLE) ×2 IMPLANT
GOWN STRL REUS W/TWL 2XL LVL3 (GOWN DISPOSABLE) ×3 IMPLANT
GOWN STRL REUS W/TWL LRG LVL3 (GOWN DISPOSABLE) ×3
GOWN STRL REUS W/TWL XL LVL3 (GOWN DISPOSABLE) ×1
GRAFT DURAGEN MATRIX 2WX2L ×1 IMPLANT
HEMOSTAT POWDER KIT SURGIFOAM (HEMOSTASIS) ×1 IMPLANT
HEMOSTAT SURGICEL 2X14 (HEMOSTASIS) ×1 IMPLANT
KIT BASIN OR (CUSTOM PROCEDURE TRAY) ×3 IMPLANT
KIT TURNOVER KIT B (KITS) ×3 IMPLANT
MARKER SPHERE PSV REFLC 13MM (MARKER) ×6 IMPLANT
NDL HYPO 25X1 1.5 SAFETY (NEEDLE) ×2 IMPLANT
NEEDLE HYPO 25X1 1.5 SAFETY (NEEDLE) ×3 IMPLANT
NS IRRIG 1000ML POUR BTL (IV SOLUTION) ×7 IMPLANT
OIL CARTRIDGE MAESTRO DRILL (MISCELLANEOUS) ×3
PACK CRANIOTOMY CUSTOM (CUSTOM PROCEDURE TRAY) ×3 IMPLANT
PAD ARMBOARD 7.5X6 YLW CONV (MISCELLANEOUS) ×9 IMPLANT
PATTIES SURGICAL .25X.25 (GAUZE/BANDAGES/DRESSINGS) IMPLANT
PATTIES SURGICAL .5 X.5 (GAUZE/BANDAGES/DRESSINGS) ×1 IMPLANT
PATTIES SURGICAL .5 X3 (DISPOSABLE) ×4 IMPLANT
PATTIES SURGICAL 1X1 (DISPOSABLE) ×1 IMPLANT
PLATE 1.5  2HOLE LNG NEURO (Plate) ×1 IMPLANT
PLATE 1.5 2HOLE LNG NEURO (Plate) IMPLANT
PLATE 1.5 6HOLE XLONG DBL Y (Plate) ×2 IMPLANT
PLATE 1.5/0.5 18.5MM BURR HOLE (Plate) ×2 IMPLANT
RUBBERBAND STERILE (MISCELLANEOUS) ×2 IMPLANT
SCREW SELF DRILL HT 1.5/4MM (Screw) ×18 IMPLANT
SPONGE NEURO XRAY DETECT 1X3 (DISPOSABLE) ×1 IMPLANT
SPONGE SURGIFOAM ABS GEL 100 (HEMOSTASIS) ×2 IMPLANT
STAPLER VISISTAT 35W (STAPLE) ×3 IMPLANT
SUT ETHILON 3 0 FSL (SUTURE) ×2 IMPLANT
SUT NURALON 4 0 TR CR/8 (SUTURE) ×9 IMPLANT
SUT VIC AB 2-0 CT1 18 (SUTURE) ×5 IMPLANT
TAPE SURG TRANSPORE 1 IN (GAUZE/BANDAGES/DRESSINGS) IMPLANT
TAPE SURGICAL TRANSPORE 1 IN (GAUZE/BANDAGES/DRESSINGS) ×1
TOWEL GREEN STERILE (TOWEL DISPOSABLE) ×3 IMPLANT
TOWEL GREEN STERILE FF (TOWEL DISPOSABLE) ×1 IMPLANT
TRAY FOLEY MTR SLVR 16FR STAT (SET/KITS/TRAYS/PACK) ×3 IMPLANT
TUBE CONNECTING 12X1/4 (SUCTIONS) ×2 IMPLANT
UNDERPAD 30X30 (UNDERPADS AND DIAPERS) ×3 IMPLANT
WATER STERILE IRR 1000ML POUR (IV SOLUTION) ×3 IMPLANT

## 2019-05-03 NOTE — Anesthesia Preprocedure Evaluation (Addendum)
Anesthesia Evaluation  Patient identified by MRN, date of birth, ID band Patient awake    Reviewed: Allergy & Precautions, NPO status , Patient's Chart, lab work & pertinent test results  Airway Mallampati: II  TM Distance: >3 FB Neck ROM: Full    Dental no notable dental hx.    Pulmonary former smoker,    Pulmonary exam normal breath sounds clear to auscultation       Cardiovascular negative cardio ROS Normal cardiovascular exam Rhythm:Regular Rate:Normal     Neuro/Psych  Headaches, Brain tumor  Vertigo Dizziness negative psych ROS   GI/Hepatic negative GI ROS, Neg liver ROS,   Endo/Other  negative endocrine ROS  Renal/GU negative Renal ROS     Musculoskeletal  (+) Arthritis , Fibromyalgia -  Abdominal   Peds  Hematology negative hematology ROS (+)   Anesthesia Other Findings Glioma  Reproductive/Obstetrics                           Anesthesia Physical Anesthesia Plan  ASA: III  Anesthesia Plan: General   Post-op Pain Management:    Induction: Intravenous  PONV Risk Score and Plan: 3 and Ondansetron, Dexamethasone and Treatment may vary due to age or medical condition  Airway Management Planned: Oral ETT  Additional Equipment: Arterial line  Intra-op Plan:   Post-operative Plan: Extubation in OR  Informed Consent: I have reviewed the patients History and Physical, chart, labs and discussed the procedure including the risks, benefits and alternatives for the proposed anesthesia with the patient or authorized representative who has indicated his/her understanding and acceptance.     Dental advisory given  Plan Discussed with: CRNA  Anesthesia Plan Comments:        Anesthesia Quick Evaluation

## 2019-05-03 NOTE — H&P (Signed)
Vanessa Whitaker is an 58 y.o. female.   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  We have extensively gone over the risks and benefits of a right frontal craniotomy for resection of tumor as well as perioperative course expectations of outcome and alternatives of surgery and she understands and agrees to proceed forward.  Past Medical History:  Diagnosis Date  . Arthritis   . Brain tumor ([place])   . Complication of anesthesia    " Metabolizer "  . Early cataracts, bilateral   . Esophageal abnormality    " spasm"   . Fibromyalgia   . Headache    migraines  . History of blood transfusion 02/2017   2 units transfused with gastric bypass surgery  . Pneumonia    as a child  . Scarlet fever   . Sleep apnea    does not need CPAP after weight loss surgery  . Vertigo   . Wears glasses     Past Surgical History:  Procedure Laterality Date  . ABDOMINAL HYSTERECTOMY     " Partial "  . APPENDECTOMY    . BARIATRIC SURGERY     Roux-en-Y  . CHOLECYSTECTOMY    . JOINT REPLACEMENT     B/L hips  . TONSILLECTOMY    . TUBAL LIGATION      Family History  Problem Relation Age of Onset  . Lung cancer Mother   . Thalassemia Mother   . Stroke Father   . Heart attack Father    Social History:  reports that she has quit smoking. She has never used smokeless tobacco. She reports previous alcohol use. She reports previous drug use.  Allergies:  Allergies  Allergen Reactions  . Tetanus Toxoids Swelling and Other (See Comments)    Redness and fever @ the site, also    Medications Prior to Admission  Medication Sig Dispense Refill  . dexamethasone (DECADRON) 4 MG tablet Take 1 tablet (4 mg total) by mouth every 6 (six) hours. 40 tablet 0  . diclofenac sodium  (VOLTAREN) 1 % GEL Place 2-4 g onto the skin 4 (four) times daily as needed (for pain).     Marland Kitchen docusate sodium (COLACE) 100 MG capsule Take 100 mg by mouth 2 (two) times daily.    Marland Kitchen gabapentin (NEURONTIN) 300 MG capsule Take 300-600 mg by mouth See admin instructions. Take 300 mg by mouth in the morning and 600 mg by mouth at bedtime    . HYDROcodone-acetaminophen (NORCO) 7.5-325 MG tablet Take 1 tablet by mouth every 6 (six) hours as needed for moderate pain.    Marland Kitchen levETIRAcetam (KEPPRA) 500 MG tablet Take 500 mg by mouth 2 (two) times daily.    . meclizine (ANTIVERT) 12.5 MG tablet Take 12.5-25 mg by mouth every 6 (six) hours as needed for dizziness or nausea.     . Multiple Vitamins-Minerals (BARIATRIC FUSION PO) Take 2 tablets by mouth 2 (two) times a day.    . oxybutynin (DITROPAN-XL) 10 MG 24 hr tablet Take 10 mg by mouth at bedtime.      Results for orders placed or performed during the hospital encounter of 05/03/19 (from the past 48 hour(s))  Type and screen [place]     Status: None   Collection Time: 05/03/19 10:25 AM  Result  Value Ref Range   ABO/RH(D) O POS    Antibody Screen NEG    Sample Expiration      05/06/2019,2359 Performed at [facility], [place] [address] [place], [place] [address]   ABO/Rh     Status: None   Collection Time: 05/03/19 10:25 AM  Result Value Ref Range   ABO/RH(D)      O POS Performed at [facility] [address], [place], [place] [address]    No results found.  Review of Systems  Gastrointestinal: Positive for nausea.  Neurological: Positive for dizziness, focal weakness and headaches.    Blood pressure (!) 146/70, pulse (!) 58, temperature 97.6 F (36.4 C), temperature source Oral, resp. rate 16, height 5' 6.75" (1.695 m), weight 83 kg, SpO2 99 %. Physical Exam  Constitutional: She is oriented to person, place, and time. She appears well-developed.  HENT:  Head: Normocephalic.  Eyes: Pupils are equal,  round, and reactive to light.  Neck: Normal range of motion.  Respiratory: Effort normal.  GI: Soft.  Neurological: She is alert and oriented to person, place, and time. She has normal strength. GCS eye subscore is 4. GCS verbal subscore is 5. GCS motor subscore is 6.  Patient is awake and alert slight left central seventh otherwise neurologically nonfocal and intact with 5-5 strength and no pronator drift  Skin: Skin is warm and dry.     Assessment/Plan 58 year old female presents for craniotomy for resection of right frontal tumor  Vanessa Whitaker P, MD 05/03/2019, 11:53 AM

## 2019-05-03 NOTE — Op Note (Signed)
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  Work-up with CT scan and MRI scan showed a solitary ring-enhancing mass right frontal lobe metastatic work-up was negative so patient was recommended open craniotomy for biopsy and resection of right frontal tumor.  I extensively went over the risks and benefits of the operation with her as well as perioperative course expectations of outcome and alternatives of surgery she understood and agreed to proceed forward.  Operative procedure: Patient brought into the OR was induced under general anesthesia positioned supine shoulder bump under her right shoulder head turned the left exposing the right side of her head.  Her head was shaved and then the Brain[facility] stereotactic navigation system was then registered incision was localized and her head was prepped and draped in routine sterile fashion.  Pterional incision was drawn out craniotomy flap was turned and subsequently extended the dura was opened up tenting over the sphenoid the tumor was immediately visualized on the cortical surface and a splayed out gyrus posteriorly.  Utilizing the Brain[facility] stereotactic navigation system identified the margins so I carried out initially sub-peel dissection around the perimeter of the mass and then working along pseudocapsule in a 3 and 6 degree orientation utilizing the [place] navigation system I developed a plane and a margin around the tumor.  All margins were developed until I was comfortable that what was felt looked mostly like edematous white matter was also confirmed with the navigation  system.  We did chase a knuckle and tail down into the tip of the lateral horn of the ventricle where we did enter into the ventricle briefly resected and extends amount of tumor off the lateral ependymal wall of the superior lateral horn of the ventricle.  After all margins were confirmed and felt there was no residual tumor left and this was explored and confirmed with navigation system the wounds and copiously irrigated meticulous hemostasis was maintained I laid Surgicel along the tumor bed and along the defect in the low ventricle wall and 2 pieces of Gelfoam on top of the Surgicel at the level of the ventricular defect.  Then reapproximated the dura and augmented it with DuraGen plus and Gelfoam reattached the flap with the Biomet plating system closed the temporalis and the scalp with interrupted Vicryl and closed the skin with a running nylon.  At the end the case all needle count sponge counts were correct.  Patient was remitted to recovery room in stable condition.

## 2019-05-03 NOTE — Anesthesia Procedure Notes (Addendum)
Procedure Name: Intubation Date/Time: 05/03/2019 12:50 PM Performed by: Leonor Liv, CRNA Pre-anesthesia Checklist: Patient identified, Emergency Drugs available, Suction available and Patient being monitored Patient Re-evaluated:Patient Re-evaluated prior to induction Oxygen Delivery Method: Circle System Utilized Preoxygenation: Pre-oxygenation with 100% oxygen Induction Type: IV induction Ventilation: Mask ventilation without difficulty Laryngoscope Size: Mac and 3 Grade View: Grade I Tube type: Oral Tube size: 7.5 mm Number of attempts: 1 Airway Equipment and Method: Stylet and Oral airway Placement Confirmation: ETT inserted through vocal cords under direct vision,  positive ETCO2 and breath sounds checked- equal and bilateral Secured at: 21 cm Tube secured with: Tape (left side) Dental Injury: Teeth and Oropharynx as per pre-operative assessment

## 2019-05-03 NOTE — Progress Notes (Signed)
Patient admitted to 4N22 at [facility] with 3 personal bags holding: pillow, blanket, wooden cross, burlap bag, cell phone, wallet, glasses, bible, personal food and drink, shaker cup, personal fan with batteries, 2 pair of shoes, personal hygiene items, extension cord, and misc clothing items. Patient is A/Ox4, provided education on personal belongings policy. Patient elected to keep items at bedside.  Candy Sledge, RN

## 2019-05-03 NOTE — Transfer of Care (Signed)
Immediate Anesthesia Transfer of Care Note  Patient: Vanessa Whitaker  Procedure(s) Performed: Craniotomy - right - Frontal stereotactic (Right Head) APPLICATION OF CRANIAL NAVIGATION (Right )  Patient Location: PACU  Anesthesia Type:General  Level of Consciousness: awake and alert   Airway & Oxygen Therapy: Patient Spontanous Breathing  Post-op Assessment: Report given to RN and Post -op Vital signs reviewed and stable  Post vital signs: Reviewed and stable  Last Vitals:  Vitals Value Taken Time  BP 131/60 05/03/19 1740  Temp    Pulse 84 05/03/19 1743  Resp 21 05/03/19 1743  SpO2 96 % 05/03/19 1743  Vitals shown include unvalidated device data.  Last Pain:  Vitals:   05/03/19 1032  TempSrc:   PainSc: 3       Patients Stated Pain Goal: 2 (36/85/99 2341)  Complications: No apparent anesthesia complications

## 2019-05-03 NOTE — Anesthesia Procedure Notes (Signed)
Arterial Line Insertion Start/End7/28/2020 11:45 AM, 05/03/2019 12:05 PM Performed by: Leonor Liv, CRNA, CRNA  Patient location: Pre-op. Preanesthetic checklist: patient identified, IV checked, site marked, risks and benefits discussed, surgical consent, monitors and equipment checked, pre-op evaluation, timeout performed and anesthesia consent Lidocaine 1% used for infiltration Left, radial was placed Catheter size: 20 G Hand hygiene performed  and maximum sterile barriers used  Allen's test indicative of satisfactory collateral circulation Attempts: 1 Procedure performed without using ultrasound guided technique. Following insertion, dressing applied and Biopatch. Post procedure assessment: normal  Patient tolerated the procedure well with no immediate complications.

## 2019-05-04 ENCOUNTER — Inpatient Hospital Stay (HOSPITAL_COMMUNITY): Payer: Managed Care, Other (non HMO)

## 2019-05-04 ENCOUNTER — Other Ambulatory Visit: Payer: Self-pay | Admitting: Radiation Therapy

## 2019-05-04 ENCOUNTER — Encounter (HOSPITAL_COMMUNITY): Payer: Self-pay | Admitting: Neurosurgery

## 2019-05-04 LAB — BASIC METABOLIC PANEL
Anion gap: 11 (ref 5–15)
BUN: 17 mg/dL (ref 6–20)
CO2: 24 mmol/L (ref 22–32)
Calcium: 8.7 mg/dL — ABNORMAL LOW (ref 8.9–10.3)
Chloride: 102 mmol/L (ref 98–111)
Creatinine, Ser: 0.7 mg/dL (ref 0.44–1.00)
GFR calc Af Amer: >60 mL/min (ref >60)
GFR calc non Af Amer: >60 mL/min (ref >60)
Glucose, Bld: 166 mg/dL — ABNORMAL HIGH (ref 70–99)
Potassium: 3.5 mmol/L (ref 3.5–5.1)
Sodium: 137 mmol/L (ref 135–145)

## 2019-05-04 MED ORDER — GADOBUTROL 1 MMOL/ML IV SOLN
10.0000 mL | Freq: Once | INTRAVENOUS | Status: AC | PRN
  Administered 2019-05-04: 8.3 mL via INTRAVENOUS

## 2019-05-04 MED ORDER — DEXAMETHASONE SODIUM PHOSPHATE 10 MG/ML IJ SOLN
10.0000 mg | Freq: Four times a day (QID) | INTRAMUSCULAR | Status: DC
  Administered 2019-05-04 – 2019-05-06 (×7): 10 mg via INTRAVENOUS
  Filled 2019-05-04 (×8): qty 1

## 2019-05-04 MED ORDER — DEXAMETHASONE SODIUM PHOSPHATE 10 MG/ML IJ SOLN
6.0000 mg | Freq: Four times a day (QID) | INTRAMUSCULAR | Status: AC
  Administered 2019-05-04: 6 mg via INTRAVENOUS
  Filled 2019-05-04: qty 1

## 2019-05-04 NOTE — Anesthesia Postprocedure Evaluation (Signed)
Anesthesia Post Note  Patient: BEV DRENNEN  Procedure(s) Performed: Craniotomy - right - Frontal stereotactic (Right Head) APPLICATION OF CRANIAL NAVIGATION (Right )     Patient location during evaluation: Other Anesthesia Type: General Level of consciousness: awake and alert Pain management: pain level controlled Vital Signs Assessment: post-procedure vital signs reviewed and stable Respiratory status: spontaneous breathing, nonlabored ventilation and respiratory function stable Cardiovascular status: blood pressure returned to baseline and stable Postop Assessment: no apparent nausea or vomiting Anesthetic complications: no    Last Vitals:  Vitals:   05/04/19 1000 05/04/19 1200  BP: 129/61   Pulse: 86   Resp: 17   Temp:  37.2 C  SpO2: 94%     Last Pain:  Vitals:   05/04/19 1200  TempSrc: Oral  PainSc:                  Tawnie Ehresman,W. EDMOND

## 2019-05-04 NOTE — Progress Notes (Signed)
Subjective: Patient reports Doing well minimal headache  Objective: Vital signs in last 24 hours: Temp:  [97.6 F (36.4 C)-99.7 F (37.6 C)] 99.7 F (37.6 C) (07/29 0400) Pulse Rate:  [58-93] 81 (07/29 0700) Resp:  [12-20] 15 (07/29 0700) BP: (125-179)/(54-82) 167/65 (07/29 0700) SpO2:  [94 %-99 %] 94 % (07/29 0700) Arterial Line BP: (142-179)/(49-70) 169/63 (07/29 0700) Weight:  [83 kg] 83 kg (07/28 1032)  Intake/Output from previous day: 07/28 0701 - 07/29 0700 In: 3442.8 [P.O.:480; I.V.:2762.8; IV Piggyback:200] Out: 3280 [Urine:3000; Blood:280] Intake/Output this shift: No intake/output data recorded.  Awake alert left facial droop much improved strength 5 out of 5 upper and lower extremities.  Lab Results: Recent Labs    05/03/19 1630 05/03/19 1756  WBC  --  8.8  HGB 12.6 12.6  HCT 37.0 38.3  PLT  --  238   BMET Recent Labs    05/03/19 1756 05/04/19 0559  NA 141 137  K 3.4* 3.5  CL 106 102  CO2 25 24  GLUCOSE 165* 166*  BUN 13 17  CREATININE 0.61 0.70  CALCIUM 8.4* 8.7*    Studies/Results: Mr Jeri Cos ZO Contrast  Result Date: 05/04/2019 CLINICAL DATA:  Anaplastic glioma/glioblastoma. Status post resection. EXAM: MRI HEAD WITHOUT AND WITH CONTRAST TECHNIQUE: Multiplanar, multiecho pulse sequences of the brain and surrounding structures were obtained without and with intravenous contrast. CONTRAST:  8.3 mL Gadavist COMPARISON:  MRI brain without and with contrast and CT head without contrast 04/29/2019 FINDINGS: Brain: The patient is status post right frontal craniotomy. Peripherally enhancing tumor has been resected. There is minimal residual enhancement along the superior and lateral aspect of the resection cavity. Blood products are present along the medial and posterior margin of the cavity. There is some enhancement anterior and superior is well. The bulk of the tumor has been resected. A fluid level is present within the cavity. Surrounding T2 signal  changes are similar the prior study. Mass effect is improved. Midline shift now measures 7 mm, compared with 10.5 mm on the prior study at the level of the mamillary bodies. No new areas of enhancement are present.  There is no acute infarct. No significant white matter disease is present in the left hemisphere. Mass effect on the right lateral ventricle remains. The brainstem and cerebellum are unremarkable. Vascular: Flow is present in the major intracranial arteries. Skull and upper cervical spine: The craniocervical junction is normal. Upper cervical spine is within normal limits. Marrow signal is unremarkable. Sinuses/Orbits: The paranasal sinuses and mastoid air cells are clear. The globes and orbits are within normal limits. IMPRESSION: 1. Interval resection of tumor in the right frontal lobe with minimal residual enhancement at the resection cavity, mostly superior and posterior with some medial superior and anterior enhancement. 2. Blood products in the resection cavity, as expected. 3. Similar appearance of the extent of T2 signal change within the right hemisphere. 4. Decreased mass effect. 5. No acute infarct. Electronically Signed   By: San Morelle M.D.   On: 05/04/2019 07:13    Assessment/Plan: Postop day 1 craniotomy for right frontal glioma.  Postop MRI scan looks very good 2 small areas of minimal rim enhancement remain.  We will continue to mobilize with physical and Occupational Therapy wean her steroids slowly.  LOS: 1 day     Vanessa Whitaker P 05/04/2019, 7:38 AM

## 2019-05-05 MED ORDER — PANTOPRAZOLE SODIUM 40 MG PO TBEC
40.0000 mg | DELAYED_RELEASE_TABLET | Freq: Every day | ORAL | Status: DC
  Administered 2019-05-05 – 2019-05-06 (×2): 40 mg via ORAL
  Filled 2019-05-05 (×2): qty 1

## 2019-05-05 NOTE — Evaluation (Signed)
Occupational Therapy Evaluation Patient Details Name: Vanessa Whitaker MRN: [id number] DOB: Aug 27, 1961 Today's Date: 05/05/2019    History of Present Illness Pt is a 58 y.o. 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    Clinical Impression   This 58 y/o female presents with the above. PTA pt was receiving some assist from spouse for ADL and mobility needs. Pt presenting with decreased activity tolerance, generalized weakness. She currently requires minguard-minA for functional mobility using RW; minA for toileting and standing grooming ADL; maxA for LB ADL. Pt reports has good support/assist from spouse for ADL/iADL needs after discharge. She will benefit from continued acute OT services to maximize her safety and independence with ADL and mobility prior to return home. Will follow.     Follow Up Recommendations  No OT follow up;Supervision/Assistance - 24 hour    Equipment Recommendations  None recommended by OT           Precautions / Restrictions Precautions Precautions: Fall Restrictions Weight Bearing Restrictions: No      Mobility Bed Mobility Overal bed mobility: Needs Assistance Bed Mobility: Supine to Sit     Supine to sit: Supervision     General bed mobility comments: Use of bed rail, exiting on right side of bed  Transfers Overall transfer level: Needs assistance Equipment used: None Transfers: Sit to/from Stand Sit to Stand: Min guard         General transfer comment: Min guard to rise from edge of bed and toilet    Balance Overall balance assessment: Needs assistance Sitting-balance support: Feet supported Sitting balance-Leahy Scale: Good     Standing balance support: No upper extremity supported;During functional activity Standing balance-Leahy Scale: Fair                              ADL either performed or assessed with clinical judgement   ADL Overall ADL's : Needs assistance/impaired Eating/Feeding: Modified independent;Sitting   Grooming: Wash/dry hands;Min guard;Standing   Upper Body Bathing: Min guard;Sitting   Lower Body Bathing: Moderate assistance;Sit to/from stand   Upper Body Dressing : Set up;Min guard;Sitting   Lower Body Dressing: Maximal assistance;Sit to/from stand Lower Body Dressing Details (indicate cue type and reason): requires assist to don socks seated EOB Toilet Transfer: Minimal assistance;Ambulation;Regular Toilet;Grab bars;RW   Toileting- Clothing Manipulation and Hygiene: Minimal assistance;Sit to/from stand Toileting - Clothing Manipulation Details (indicate cue type and reason): for gown management; pt performing pericare via lateral leans     Functional mobility during ADLs: Minimal assistance;Min guard;Rolling walker       Vision Baseline Vision/History: Wears glasses Wears Glasses: At all times Patient Visual Report: No change from baseline Vision Assessment?: No apparent visual deficits Additional Comments: some ptosis noted in R eye, overall no visual deficits noted     Perception     Praxis      Pertinent Vitals/Pain Pain Assessment: Faces Faces Pain Scale: Hurts little more Pain Location: headache Pain Descriptors / Indicators: Headache Pain Intervention(s): Monitored during session;RN gave pain meds during session     Hand Dominance Right   Extremity/Trunk Assessment Upper Extremity Assessment Upper Extremity Assessment: Generalized weakness   Lower Extremity Assessment Lower Extremity Assessment: Defer to PT evaluation   Cervical / Trunk Assessment Cervical / Trunk Assessment: Normal   Communication Communication Communication:  No difficulties   Cognition Arousal/Alertness: Awake/alert Behavior During Therapy: WFL for tasks assessed/performed Overall Cognitive Status: Within  Functional Limits for tasks assessed                                     General Comments  VSS    Exercises     Shoulder Instructions      Home Living Family/patient expects to be discharged to:: Private residence Living Arrangements: Spouse/significant other Available Help at Discharge: Family Type of Home: House Home Access: Stairs to enter Technical brewer of Steps: 5   Home Layout: Able to live on main level with bedroom/bathroom     Bathroom Shower/Tub: Teacher, early years/pre: Standard     Home Equipment: Environmental consultant - 2 wheels;Tub bench;Hand held shower head          Prior Functioning/Environment Level of Independence: Needs assistance  Gait / Transfers Assistance Needed: relies on husband's arm when needed ADL's / Homemaking Assistance Needed: husband assists with lower body dressing sometimes, IADL's   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        OT Problem List: Decreased strength;Decreased range of motion;Decreased activity tolerance;Impaired balance (sitting and/or standing);Pain      OT Treatment/Interventions: Self-care/ADL training;Therapeutic exercise;Energy conservation;Therapeutic activities;Patient/family education;Balance training;DME and/or AE instruction    OT Goals(Current goals can be found in the care plan section) Acute Rehab OT Goals Patient Stated Goal: "Get up out of bed." OT Goal Formulation: With patient Time For Goal Achievement: 05/19/19 Potential to Achieve Goals: Good  OT Frequency: Min 2X/week   Barriers to D/C:            Co-evaluation PT/OT/SLP Co-Evaluation/Treatment: Yes Reason for Co-Treatment: For patient/therapist safety;To address functional/ADL transfers   OT goals addressed during session: ADL's and self-care      AM-PAC OT "6 Clicks" Daily Activity     Outcome Measure Help from another person eating meals?: None Help from another person taking care of personal grooming?: None Help  from another person toileting, which includes using toliet, bedpan, or urinal?: A Little Help from another person bathing (including washing, rinsing, drying)?: A Little Help from another person to put on and taking off regular upper body clothing?: None Help from another person to put on and taking off regular lower body clothing?: A Lot 6 Click Score: 20   End of Session Equipment Utilized During Treatment: Gait belt;Rolling walker Nurse Communication: Mobility status  Activity Tolerance: Patient tolerated treatment well Patient left: in chair;with call bell/phone within reach;with chair alarm set  OT Visit Diagnosis: Muscle weakness (generalized) (M62.81);Unsteadiness on feet (R26.81)                Time: 9147-8295 OT Time Calculation (min): 30 min Charges:  OT General Charges $OT Visit: 1 Visit OT Evaluation $OT Eval Moderate Complexity: [place], OT [employer] Pager [phone] Office [phone]    Raymondo Band 05/05/2019, 4:12 PM

## 2019-05-05 NOTE — Evaluation (Signed)
Physical Therapy Evaluation Patient Details Name: Vanessa Whitaker MRN: [id number] DOB: Jun 18, 1961 Today's Date: 05/05/2019   History of Present Illness  Pt is a 58 y.o. 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   Clinical Impression  Pt presents s/p procedure above. Pt very pleasant and eager/motivated to get out of bed. Presents with decreased functional mobility secondary to headache, decreased activity tolerance and balance impairments. Ambulating 150 feet with walker and min guard assist (pt preferring use of walker to feel more secure). Will benefit from stair training prior to discharge home.      Follow Up Recommendations Supervision for mobility/OOB;Outpatient PT    Equipment Recommendations  None recommended by PT (has walker)   Recommendations for Other Services       Precautions / Restrictions Precautions Precautions: Fall Restrictions Weight Bearing Restrictions: No      Mobility  Bed Mobility Overal bed mobility: Needs Assistance Bed Mobility: Supine to Sit     Supine to sit: Supervision     General bed mobility comments: Use of bed rail, exiting on right side of bed  Transfers Overall transfer level: Needs assistance Equipment used: None Transfers: Sit to/from Stand Sit to Stand: Min guard         General transfer comment: Min guard to rise from edge of bed and toilet  Ambulation/Gait Ambulation/Gait assistance: Min guard Gait Distance (Feet): 150 Feet Assistive device: Rolling walker (2 wheeled) Gait Pattern/deviations: Step-through pattern;Decreased stride length;Drifts right/left Gait velocity: decreased   General Gait Details: Pt with mild instability requiring min guard assist, preferring use of walker. Occasionally drifting towards right but able to self correct. Able to  perform head turns, stops/starts and varying gait speeds without overt LOB  Stairs            Wheelchair Mobility    Modified Rankin (Stroke Patients Only)       Balance Overall balance assessment: Needs assistance Sitting-balance support: Feet supported Sitting balance-Leahy Scale: Good     Standing balance support: No upper extremity supported;During functional activity Standing balance-Leahy Scale: Fair                               Pertinent Vitals/Pain Pain Assessment: Faces Faces Pain Scale: Hurts little more Pain Location: headache Pain Descriptors / Indicators: Headache Pain Intervention(s): Monitored during session;Premedicated before session    Home Living Family/patient expects to be discharged to:: Private residence Living Arrangements: Spouse/significant other Available Help at Discharge: Family Type of Home: House Home Access: Stairs to enter   Technical brewer of Steps: 5 Home Layout: Able to live on main level with bedroom/bathroom Home Equipment: Walker - 2 wheels;Tub bench;Hand held shower head      Prior Function Level of Independence: Needs assistance   Gait / Transfers Assistance Needed: relies on husband's arm when needed  ADL's / Homemaking Assistance Needed: husband assists with lower body dressing sometimes, IADL's  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        Extremity/Trunk Assessment   Upper Extremity Assessment Upper Extremity Assessment: Defer to OT evaluation    Lower Extremity Assessment Lower Extremity Assessment: Overall WFL for tasks assessed    Cervical / Trunk Assessment Cervical / Trunk Assessment: Normal  Communication   Communication: No difficulties  Cognition Arousal/Alertness: Awake/alert Behavior During Therapy: WFL for tasks assessed/performed Overall Cognitive Status: Within Functional Limits for tasks assessed                                         General Comments      Exercises     Assessment/Plan    PT Assessment Patient needs continued PT services  PT Problem List Decreased strength;Decreased activity tolerance;Decreased mobility;Decreased balance;Pain       PT Treatment Interventions Gait training;Stair training;Functional mobility training;Therapeutic activities;Therapeutic exercise;Balance training;Patient/family education    PT Goals (Current goals can be found in the Care Plan section)  Acute Rehab PT Goals Patient Stated Goal: "Get up out of bed." PT Goal Formulation: With patient Time For Goal Achievement: 05/19/19 Potential to Achieve Goals: Good    Frequency Min 3X/week   Barriers to discharge        Co-evaluation PT/OT/SLP Co-Evaluation/Treatment: Yes Reason for Co-Treatment: For patient/therapist safety;To address functional/ADL transfers PT goals addressed during session: Mobility/safety with mobility         AM-PAC PT "6 Clicks" Mobility  Outcome Measure Help needed turning from your back to your side while in a flat bed without using bedrails?: None Help needed moving from lying on your back to sitting on the side of a flat bed without using bedrails?: None Help needed moving to and from a bed to a chair (including a wheelchair)?: A Little Help needed standing up from a chair using your arms (e.g., wheelchair or bedside chair)?: A Little Help needed to walk in hospital room?: A Little Help needed climbing 3-5 steps with a railing? : A Little 6 Click Score: 20    End of Session Equipment Utilized During Treatment: Gait belt Activity Tolerance: Patient tolerated treatment well Patient left: in chair;with call bell/phone within reach;with chair alarm set Nurse Communication: Mobility status PT Visit Diagnosis: Unsteadiness on feet (R26.81);Pain Pain - part of body: (head)    Time: 1683-7290 PT Time Calculation (min) (ACUTE ONLY): 30 min   Charges:   PT Evaluation $PT Eval Moderate  Complexity: 1 Mod          Ellamae Sia, [place], DPT Acute Rehabilitation Services Pager [phone] Office [phone]   Willy Eddy 05/05/2019, 12:10 PM

## 2019-05-05 NOTE — Progress Notes (Signed)
Subjective: Patient reports Patient doing well improved headache improved appetite  Objective: Vital signs in last 24 hours: Temp:  [98.7 F (37.1 C)-100.5 F (38.1 C)] 98.7 F (37.1 C) (07/30 0400) Pulse Rate:  [69-101] 71 (07/30 0700) Resp:  [12-21] 12 (07/30 0700) BP: (115-141)/(56-66) 117/59 (07/30 0700) SpO2:  [94 %-98 %] 98 % (07/30 0700) Arterial Line BP: (199)/(66) 199/66 (07/29 0800)  Intake/Output from previous day: 07/29 0701 - 07/30 0700 In: 2123.2 [P.O.:120; I.V.:1697.5; IV Piggyback:305.7] Out: 1600 [Urine:1600] Intake/Output this shift: No intake/output data recorded.  Awake alert and oriented strength 5 out of 5 wound clean dry and intact  Lab Results: Recent Labs    05/03/19 1630 05/03/19 1756  WBC  --  8.8  HGB 12.6 12.6  HCT 37.0 38.3  PLT  --  238   BMET Recent Labs    05/03/19 1756 05/04/19 0559  NA 141 137  K 3.4* 3.5  CL 106 102  CO2 25 24  GLUCOSE 165* 166*  BUN 13 17  CREATININE 0.61 0.70  CALCIUM 8.4* 8.7*    Studies/Results: Mr Jeri Cos AT Contrast  Result Date: 05/04/2019 CLINICAL DATA:  Anaplastic glioma/glioblastoma. Status post resection. EXAM: MRI HEAD WITHOUT AND WITH CONTRAST TECHNIQUE: Multiplanar, multiecho pulse sequences of the brain and surrounding structures were obtained without and with intravenous contrast. CONTRAST:  8.3 mL Gadavist COMPARISON:  MRI brain without and with contrast and CT head without contrast 04/29/2019 FINDINGS: Brain: The patient is status post right frontal craniotomy. Peripherally enhancing tumor has been resected. There is minimal residual enhancement along the superior and lateral aspect of the resection cavity. Blood products are present along the medial and posterior margin of the cavity. There is some enhancement anterior and superior is well. The bulk of the tumor has been resected. A fluid level is present within the cavity. Surrounding T2 signal changes are similar the prior study. Mass effect  is improved. Midline shift now measures 7 mm, compared with 10.5 mm on the prior study at the level of the mamillary bodies. No new areas of enhancement are present.  There is no acute infarct. No significant white matter disease is present in the left hemisphere. Mass effect on the right lateral ventricle remains. The brainstem and cerebellum are unremarkable. Vascular: Flow is present in the major intracranial arteries. Skull and upper cervical spine: The craniocervical junction is normal. Upper cervical spine is within normal limits. Marrow signal is unremarkable. Sinuses/Orbits: The paranasal sinuses and mastoid air cells are clear. The globes and orbits are within normal limits. IMPRESSION: 1. Interval resection of tumor in the right frontal lobe with minimal residual enhancement at the resection cavity, mostly superior and posterior with some medial superior and anterior enhancement. 2. Blood products in the resection cavity, as expected. 3. Similar appearance of the extent of T2 signal change within the right hemisphere. 4. Decreased mass effect. 5. No acute infarct. Electronically Signed   By: San Morelle M.D.   On: 05/04/2019 07:13    Assessment/Plan: Postop day 2 craniotomy for evacuation of right frontal glioma doing very well significant provement headache and appetite.  Took off the dressing will mobilize her with physical and Occupational Therapy transfer to the floor  LOS: 2 days     Bindu Docter P 05/05/2019, 7:59 AM

## 2019-05-06 MED ORDER — OXYCODONE HCL 5 MG PO TABS
5.0000 mg | ORAL_TABLET | ORAL | Status: DC | PRN
  Administered 2019-05-06: 5 mg via ORAL
  Filled 2019-05-06: qty 1

## 2019-05-06 MED ORDER — OXYCODONE HCL 5 MG PO TABS
10.0000 mg | ORAL_TABLET | ORAL | Status: DC | PRN
  Administered 2019-05-06 – 2019-05-07 (×3): 10 mg via ORAL
  Filled 2019-05-06 (×3): qty 2

## 2019-05-06 MED ORDER — DEXAMETHASONE 4 MG PO TABS
4.0000 mg | ORAL_TABLET | Freq: Two times a day (BID) | ORAL | Status: DC
  Administered 2019-05-06 – 2019-05-07 (×3): 4 mg via ORAL
  Filled 2019-05-06 (×3): qty 1

## 2019-05-06 MED ORDER — LEVETIRACETAM 500 MG PO TABS
500.0000 mg | ORAL_TABLET | Freq: Two times a day (BID) | ORAL | Status: DC
  Administered 2019-05-06 – 2019-05-07 (×2): 500 mg via ORAL
  Filled 2019-05-06 (×2): qty 1

## 2019-05-06 NOTE — Progress Notes (Addendum)
Physical Therapy Treatment Patient Details Name: Vanessa Whitaker MRN: [id number] DOB: [birth year] Today's Date: 05/06/2019    History of Present Illness Pt is a 58 y.o. 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     PT Comments    Pt progressing well with physical therapy goals today, reports mild headache. Shows improvement in balance and activity tolerance. Ambulating 500 feet with and without walker at supervision level. Negotiating 10 steps with min assist. Education re: activity modifications, lifting precautions, use of DME when needed.     Follow Up Recommendations  Supervision for mobility/OOB;Outpatient PT     Equipment Recommendations  None recommended by PT    Recommendations for Other Services       Precautions / Restrictions Precautions Precautions: Fall Restrictions Weight Bearing Restrictions: No    Mobility  Bed Mobility Overal bed mobility: Independent                Transfers Overall transfer level: Independent Equipment used: None                Ambulation/Gait Ambulation/Gait assistance: Supervision Gait Distance (Feet): 500 Feet Assistive device: Rolling walker (2 wheeled);None Gait Pattern/deviations: WFL(Within Functional Limits) Gait velocity: decreased   General Gait Details: Walking with and without walker with supervision, improved speed and stability today, decreased reliance through arms.    Stairs Stairs: Yes Stairs assistance: Min assist;Min guard Stair Management: One rail Left Number of Stairs: 10 General stair comments: Cues for step by step pattern, min guard ascending, min assist descending with handheld assist   Wheelchair Mobility    Modified Rankin (Stroke Patients Only)       Balance Overall balance assessment: Needs  assistance Sitting-balance support: Feet supported Sitting balance-Leahy Scale: Good     Standing balance support: No upper extremity supported;During functional activity Standing balance-Leahy Scale: Good                              Cognition Arousal/Alertness: Awake/alert Behavior During Therapy: WFL for tasks assessed/performed Overall Cognitive Status: Within Functional Limits for tasks assessed                                        Exercises      General Comments        Pertinent Vitals/Pain Pain Assessment: Faces Faces Pain Scale: Hurts little more Pain Location: headache Pain Descriptors / Indicators: Headache Pain Intervention(s): Monitored during session;Patient requesting pain meds-RN notified    Home Living                      Prior Function            PT Goals (current goals can now be found in the care plan section) Acute Rehab PT Goals Patient Stated Goal: "Get up out of bed." PT Goal Formulation: With patient Time For Goal Achievement: 05/19/19 Potential to Achieve Goals: Good Progress towards PT goals: Progressing toward goals    Frequency    Min 3X/week      PT Plan Current plan remains appropriate    Co-evaluation              AM-PAC PT "6 Clicks" Mobility  Outcome Measure  Help needed turning from your back to your side while in a flat bed without using bedrails?: None Help needed moving from lying on your back to sitting on the side of a flat bed without using bedrails?: None Help needed moving to and from a bed to a chair (including a wheelchair)?: A Little Help needed standing up from a chair using your arms (e.g., wheelchair or bedside chair)?: A Little Help needed to walk in hospital room?: A Little Help needed climbing 3-5 steps with a railing? : A Little 6 Click Score: 20    End of Session Equipment Utilized During Treatment: Gait belt Activity Tolerance: Patient tolerated  treatment well Patient left: with call bell/phone within reach;in bed Nurse Communication: Mobility status PT Visit Diagnosis: Unsteadiness on feet (R26.81);Pain Pain - part of body: (head)     Time: 5374-8270 PT Time Calculation (min) (ACUTE ONLY): 22 min  Charges:  $Gait Training: 8-22 mins                     Ellamae Sia, [place], DPT Acute Rehabilitation Services Pager [phone] Office [phone]    Willy Eddy 05/06/2019, 5:02 PM

## 2019-05-06 NOTE — Progress Notes (Signed)
Neurosurgery Service Progress Note  Subjective: No acute events overnight, no new complaints   Objective: Vitals:   05/05/19 1943 05/06/19 0000 05/06/19 0400 05/06/19 0835  BP: 127/71 131/73 125/90 139/76  Pulse: 85 79 80 70  Resp: 18 18 16 16   Temp: 98.9 F (37.2 C) 98.8 F (37.1 C) 98.5 F (36.9 C) 98.4 F (36.9 C)  TempSrc: Oral Oral Oral Oral  SpO2: 95% 100% 96% 96%  Weight:      Height:       Temp (24hrs), Avg:98.8 F (37.1 C), Min:98.4 F (36.9 C), Max:99.2 F (37.3 C)  CBC Latest Ref Rng & Units 05/03/2019 05/03/2019 04/29/2019  WBC 4.0 - 10.5 K/uL 8.8 - 9.8  Hemoglobin 12.0 - 15.0 g/dL 12.6 12.6 14.4  Hematocrit 36.0 - 46.0 % 38.3 37.0 44.0  Platelets 150 - 400 K/uL 238 - 311   BMP Latest Ref Rng & Units 05/04/2019 05/03/2019 05/03/2019  Glucose 70 - 99 mg/dL 166(H) 165(H) 121(H)  BUN 6 - 20 mg/dL 17 13 -  Creatinine 0.44 - 1.00 mg/dL 0.70 0.61 -  Sodium 135 - 145 mmol/L 137 141 142  Potassium 3.5 - 5.1 mmol/L 3.5 3.4(L) 3.7  Chloride 98 - 111 mmol/L 102 106 -  CO2 22 - 32 mmol/L 24 25 -  Calcium 8.9 - 10.3 mg/dL 8.7(L) 8.4(L) -    Intake/Output Summary (Last 24 hours) at 05/06/2019 0910 Last data filed at 05/06/2019 0840 Gross per 24 hour  Intake 491 ml  Output 1150 ml  Net -659 ml    Current Facility-Administered Medications:  .  0.9 %  sodium chloride infusion, , Intravenous, Continuous, Kary Kos, MD, Last Rate: 10 mL/hr at 05/05/19 2111 .  0.9 % NaCl with KCl 20 mEq/ L  infusion, , Intravenous, Continuous, Kary Kos, MD, Stopped at 05/05/19 7616 .  dexamethasone (DECADRON) tablet 4 mg, 4 mg, Oral, Q12H, Kaysey Berndt A, MD .  docusate sodium (COLACE) capsule 100 mg, 100 mg, Oral, BID, Kary Kos, MD, 100 mg at 05/05/19 2115 .  gabapentin (NEURONTIN) capsule 300 mg, 300 mg, Oral, Daily, 300 mg at 05/05/19 0915 **AND** gabapentin (NEURONTIN) capsule 600 mg, 600 mg, Oral, QHS, Kary Kos, MD, 600 mg at 05/05/19 2114 .  hydrALAZINE (APRESOLINE)  injection 10-20 mg, 10-20 mg, Intravenous, Q2H PRN, Kary Kos, MD, 20 mg at 05/04/19 0729 .  HYDROmorphone (DILAUDID) injection 0.5-1 mg, 0.5-1 mg, Intravenous, Q2H PRN, Kary Kos, MD, 1 mg at 05/06/19 0737 .  labetalol (NORMODYNE) injection 10-40 mg, 10-40 mg, Intravenous, Q10 min PRN, Kary Kos, MD, 10 mg at 05/03/19 1823 .  levETIRAcetam (KEPPRA) tablet 500 mg, 500 mg, Oral, BID, Kristof Nadeem, Joyice Faster, MD .  meclizine (ANTIVERT) tablet 12.5-25 mg, 12.5-25 mg, Oral, Q6H PRN, Kary Kos, MD .  multivitamin with minerals tablet 1 tablet, 1 tablet, Oral, BID, Kary Kos, MD, 1 tablet at 05/05/19 2114 .  ondansetron (ZOFRAN) tablet 4 mg, 4 mg, Oral, Q4H PRN **OR** ondansetron (ZOFRAN) injection 4 mg, 4 mg, Intravenous, Q4H PRN, Kary Kos, MD .  oxybutynin (DITROPAN-XL) 24 hr tablet 10 mg, 10 mg, Oral, QHS, Kary Kos, MD, 10 mg at 05/05/19 2348 .  oxyCODONE (Oxy IR/ROXICODONE) immediate release tablet 10 mg, 10 mg, Oral, Q4H PRN, Parthena Fergeson A, MD .  oxyCODONE (Oxy IR/ROXICODONE) immediate release tablet 5 mg, 5 mg, Oral, Q4H PRN, Franny Selvage A, MD .  pantoprazole (PROTONIX) EC tablet 40 mg, 40 mg, Oral, QHS, Kary Kos, MD, 40 mg at 05/05/19  2114 .  promethazine (PHENERGAN) tablet 12.5-25 mg, 12.5-25 mg, Oral, Q4H PRN, Kary Kos, MD .  senna-docusate (Senokot-S) tablet 1 tablet, 1 tablet, Oral, QHS PRN, Kary Kos, MD   Physical Exam: AOx3, PERRL, EOMI, FS, Strength 5/5 x4, SILTx4, no drift Incision c/d/i  Assessment & Plan: 58 y.o. woman s/p crani for rsxn of R F enhancing mass, recovering well.  -floor status, d/c tele -dex to 4bid po -keppra to po -hydromorph to oxy for better po pain control -likely discharge tomorrow  Judith Part  05/06/19 9:10 AM

## 2019-05-07 MED ORDER — OXYCODONE HCL 5 MG PO TABS: 5.0000 mg | ORAL_TABLET | ORAL | 0 refills | Status: AC | PRN

## 2019-05-07 NOTE — Discharge Instructions (Signed)
Discharge Instructions  No restriction in activities, slowly increase your activity back to normal.   Okay to shower on the day of discharge. Use regular soap and water and try to be gentle when cleaning your incision.   Follow up with Dr. Saintclair Halsted in 2 weeks after discharge. If you do not already have a discharge appointment, please call his office at [phone] to schedule a follow up appointment. If you have any concerns or questions, please call the office and let us know.

## 2019-05-07 NOTE — Discharge Summary (Signed)
Discharge Summary  Date of Admission: 05/03/2019  Date of Discharge: 05/07/19  Attending Physician: Kary Kos, MD  Hospital Course: Patient presented with headaches and dizziness, imaging revealed a large right frontal enhancing mass. She went to the OR on 05/03/2019 for a right frontal craniotomy for tumor resection. She was recovered in PACU and transferred to 4N. A post-op MRI showed good resection of the mass. Her preop facial droop resolved and she recovered very well. Her hospital course was uncomplicated and the patient was discharged home on 05/07/2019. She will follow up in clinic with Dr. Saintclair Halsted in 2 weeks. Final pathology was still pending at the time of discharge and will be discussed at follow up.  Neurologic exam at discharge:  AOx3, PERRL, EOMI, FS, TM Strength 5/5 x4, SILTx4, no drift  Discharge diagnosis: Brain tumor  Judith Part, MD 05/07/19 9:57 AM

## 2019-05-07 NOTE — Plan of Care (Signed)
  Problem: Education: Goal: Knowledge of the prescribed therapeutic regimen will improve Outcome: Progressing   Problem: Activity: Goal: Ability to tolerate increased activity will improve Outcome: Progressing   Problem: Health Behavior/Discharge Planning: Goal: Identification of resources available to assist in meeting health care needs will improve Outcome: Progressing   Problem: Nutrition: Goal: Maintenance of adequate nutrition will improve Outcome: Progressing   Problem: Clinical Measurements: Goal: Complications related to the disease process, condition or treatment will be avoided or minimized Outcome: Progressing   Problem: Respiratory: Goal: Will regain and/or maintain adequate ventilation Outcome: Progressing   Problem: Skin Integrity: Goal: Demonstration of wound healing without infection will improve Outcome: Progressing

## 2019-05-07 NOTE — Progress Notes (Signed)
Neurosurgery Service Progress Note  Subjective: No acute events overnight, no new complaints   Objective: Vitals:   05/06/19 0000 05/06/19 0400 05/06/19 0835 05/06/19 2012  BP: 131/73 125/90 139/76 120/70  Pulse: 79 80 70 68  Resp: 18 16 16    Temp: 98.8 F (37.1 C) 98.5 F (36.9 C) 98.4 F (36.9 C) 98.6 F (37 C)  TempSrc: Oral Oral Oral Oral  SpO2: 100% 96% 96% 96%  Weight:      Height:       Temp (24hrs), Avg:98.4 F (36.9 C), Min:98.1 F (36.7 C), Max:98.6 F (37 C)  CBC Latest Ref Rng & Units 05/03/2019 05/03/2019 04/29/2019  WBC 4.0 - 10.5 K/uL 8.8 - 9.8  Hemoglobin 12.0 - 15.0 g/dL 12.6 12.6 14.4  Hematocrit 36.0 - 46.0 % 38.3 37.0 44.0  Platelets 150 - 400 K/uL 238 - 311   BMP Latest Ref Rng & Units 05/04/2019 05/03/2019 05/03/2019  Glucose 70 - 99 mg/dL 166(H) 165(H) 121(H)  BUN 6 - 20 mg/dL 17 13 -  Creatinine 0.44 - 1.00 mg/dL 0.70 0.61 -  Sodium 135 - 145 mmol/L 137 141 142  Potassium 3.5 - 5.1 mmol/L 3.5 3.4(L) 3.7  Chloride 98 - 111 mmol/L 102 106 -  CO2 22 - 32 mmol/L 24 25 -  Calcium 8.9 - 10.3 mg/dL 8.7(L) 8.4(L) -   No intake or output data in the 24 hours ending 05/07/19 0955  Current Facility-Administered Medications:  .  0.9 %  sodium chloride infusion, , Intravenous, Continuous, Kary Kos, MD, Last Rate: 10 mL/hr at 05/05/19 2111 .  0.9 % NaCl with KCl 20 mEq/ L  infusion, , Intravenous, Continuous, Kary Kos, MD, Stopped at 05/05/19 2951 .  dexamethasone (DECADRON) tablet 4 mg, 4 mg, Oral, Q12H, Reedy Biernat, Joyice Faster, MD, 4 mg at 05/07/19 0903 .  docusate sodium (COLACE) capsule 100 mg, 100 mg, Oral, BID, Kary Kos, MD, 100 mg at 05/07/19 0903 .  gabapentin (NEURONTIN) capsule 300 mg, 300 mg, Oral, Daily, 300 mg at 05/07/19 0902 **AND** gabapentin (NEURONTIN) capsule 600 mg, 600 mg, Oral, QHS, Kary Kos, MD, 600 mg at 05/06/19 2116 .  hydrALAZINE (APRESOLINE) injection 10-20 mg, 10-20 mg, Intravenous, Q2H PRN, Kary Kos, MD, 20 mg at 05/04/19  0729 .  HYDROmorphone (DILAUDID) injection 0.5-1 mg, 0.5-1 mg, Intravenous, Q2H PRN, Kary Kos, MD, 1 mg at 05/06/19 8841 .  labetalol (NORMODYNE) injection 10-40 mg, 10-40 mg, Intravenous, Q10 min PRN, Kary Kos, MD, 10 mg at 05/03/19 1823 .  levETIRAcetam (KEPPRA) tablet 500 mg, 500 mg, Oral, BID, Judith Part, MD, 500 mg at 05/07/19 0903 .  meclizine (ANTIVERT) tablet 12.5-25 mg, 12.5-25 mg, Oral, Q6H PRN, Kary Kos, MD .  multivitamin with minerals tablet 1 tablet, 1 tablet, Oral, BID, Kary Kos, MD, 1 tablet at 05/07/19 [id number] .  ondansetron (ZOFRAN) tablet 4 mg, 4 mg, Oral, Q4H PRN **OR** ondansetron (ZOFRAN) injection 4 mg, 4 mg, Intravenous, Q4H PRN, Kary Kos, MD .  oxybutynin (DITROPAN-XL) 24 hr tablet 10 mg, 10 mg, Oral, QHS, Kary Kos, MD, 10 mg at 05/06/19 2231 .  oxyCODONE (Oxy IR/ROXICODONE) immediate release tablet 10 mg, 10 mg, Oral, Q4H PRN, Judith Part, MD, 10 mg at 05/07/19 0903 .  oxyCODONE (Oxy IR/ROXICODONE) immediate release tablet 5 mg, 5 mg, Oral, Q4H PRN, Judith Part, MD, 5 mg at 05/06/19 1025 .  pantoprazole (PROTONIX) EC tablet 40 mg, 40 mg, Oral, QHS, Kary Kos, MD, 40 mg at 05/06/19 2116 .  promethazine (PHENERGAN) tablet 12.5-25 mg, 12.5-25 mg, Oral, Q4H PRN, Kary Kos, MD .  senna-docusate (Senokot-S) tablet 1 tablet, 1 tablet, Oral, QHS PRN, Kary Kos, MD   Physical Exam: AOx3, PERRL, EOMI, FS, Strength 5/5 x4, SILTx4, no drift Incision c/d/i  Assessment & Plan: 58 y.o. woman s/p crani for rsxn of R F enhancing mass, recovering well.  -discharge home today  Judith Part  05/07/19 9:55 AM

## 2019-05-16 ENCOUNTER — Inpatient Hospital Stay: Payer: Managed Care, Other (non HMO)

## 2019-06-01 ENCOUNTER — Inpatient Hospital Stay: Payer: Managed Care, Other (non HMO) | Attending: Internal Medicine

## 2019-06-06 ENCOUNTER — Encounter (HOSPITAL_COMMUNITY): Payer: Self-pay

## 2020-12-05 IMAGING — MR MRI HEAD WITHOUT AND WITH CONTRAST
9 of 13 series · 29 of 48 positions shown · IV contrast (gadavist)
Comparison: MRI brain without and with contrast and CT head without
contrast 04/29/2019

CLINICAL DATA: Anaplastic glioma/glioblastoma. Status post
resection.

EXAM:
MRI HEAD WITHOUT AND WITH CONTRAST
TECHNIQUE: Multiplanar, multiecho pulse sequences of the brain and surrounding
structures were obtained without and with intravenous contrast.
CONTRAST:  8.3 mL Gadavist

[Series 2: DWI · axial · 3.0mm · 0.94mm/px · z∈[-26,+120]mm · 6 of 104 slices shown (1 of 2)]
[im 1/104]
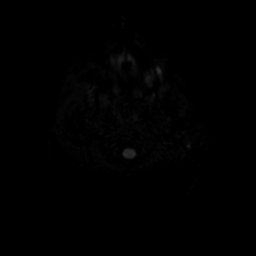
[im 21/104]
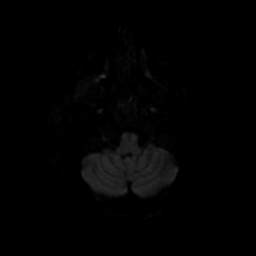
[im 42/104]
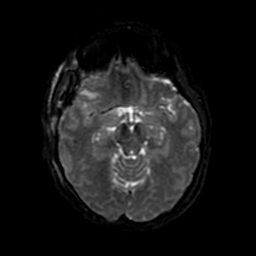
[im 62/104]
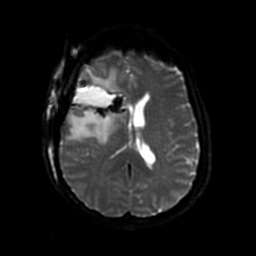
[im 83/104]
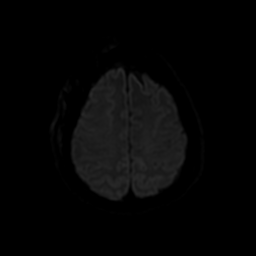
[im 104/104]
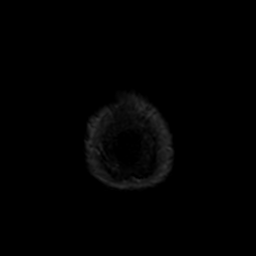

[Series 3: DWI · coronal · 4.0mm · 0.94mm/px · 5 of 72 slices shown (2 of 2)]
[im 1/72]
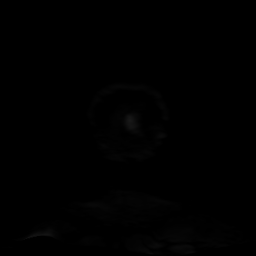
[im 18/72]
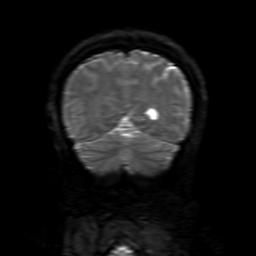
[im 36/72]
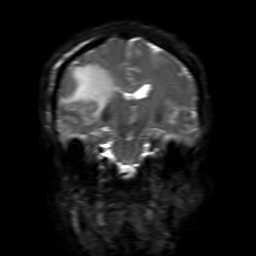
[im 54/72]
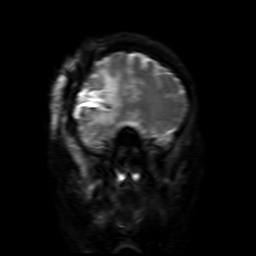
[im 72/72]
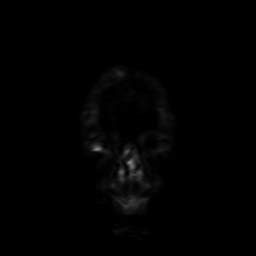

[Series 4: FLAIR · sagittal · 5.0mm · 0.47mm/px · 2 of 25 slices shown (1 of 2)]
[im 1/25]
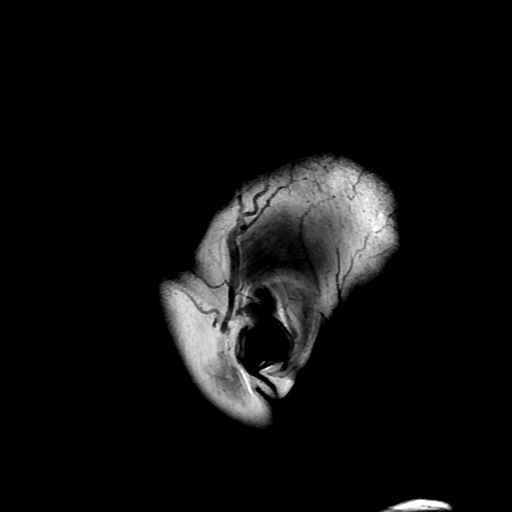
[im 25/25]
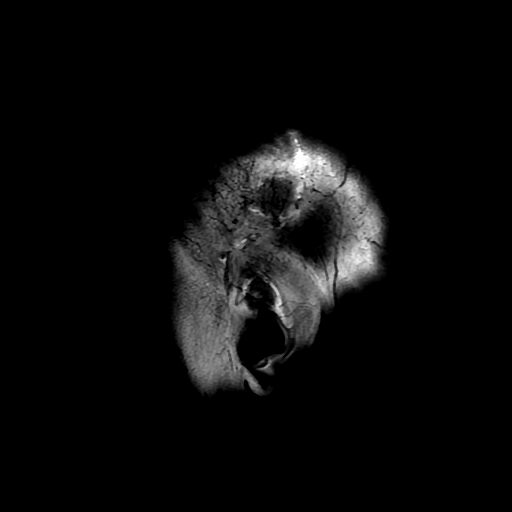

[Series 5: T2 · axial · 5.0mm · 0.47mm/px · z∈[-55,+111]mm · 2 of 29 slices shown]
[im 1/29]
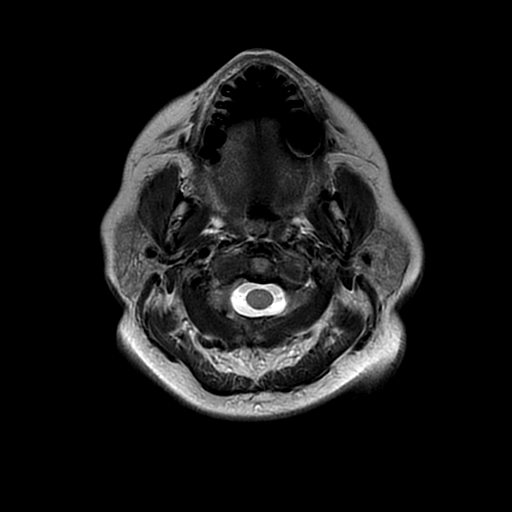
[im 29/29]
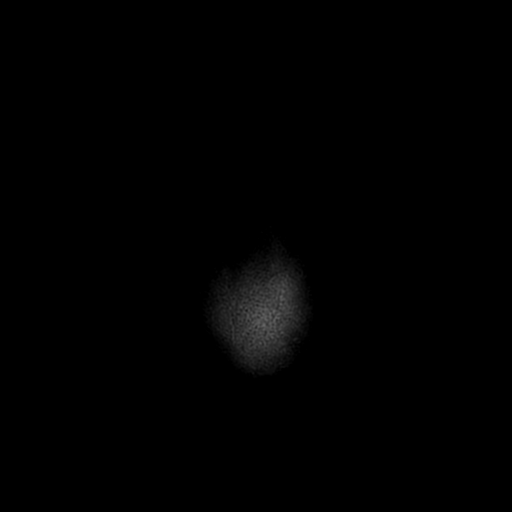

[Series 6: FLAIR · axial · 5.0mm · 0.47mm/px · z∈[-55,+111]mm · 2 of 29 slices shown (2 of 2)]
[im 1/29]
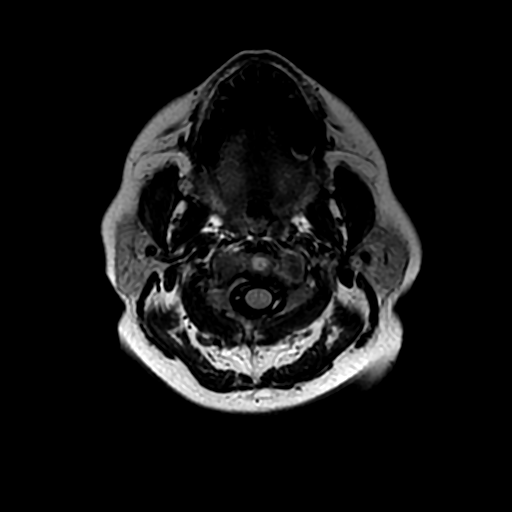
[im 29/29]
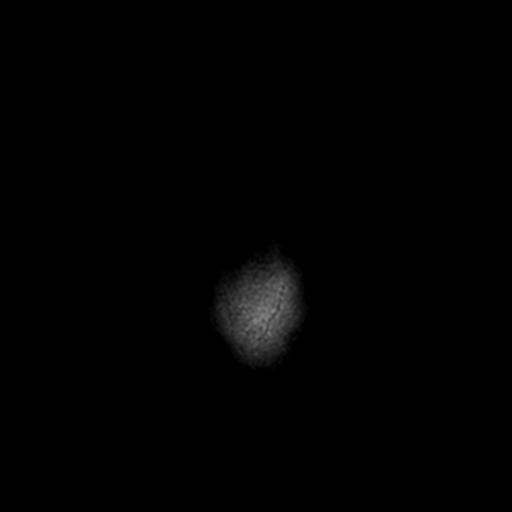

[Series 7: SWI · axial · 3.0mm · 0.47mm/px · z∈[-37,+60]mm · 5 of 100 slices shown]
[im 1/100]
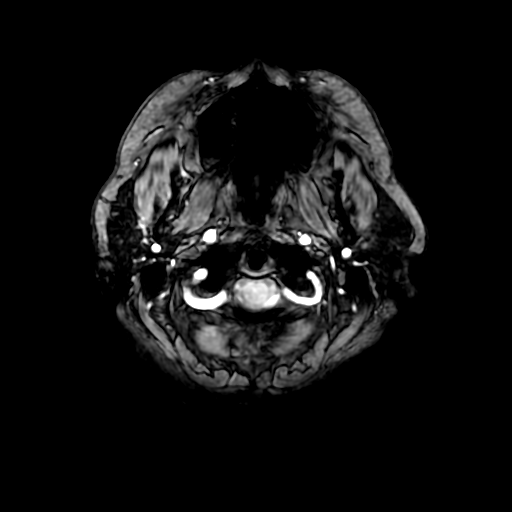
[im 17/100]
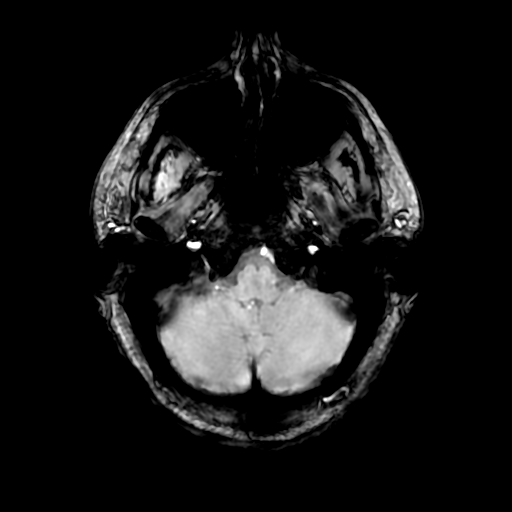
[im 34/100]
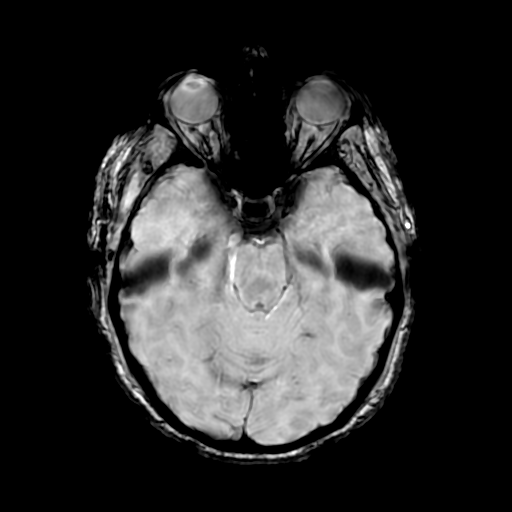
[im 50/100]
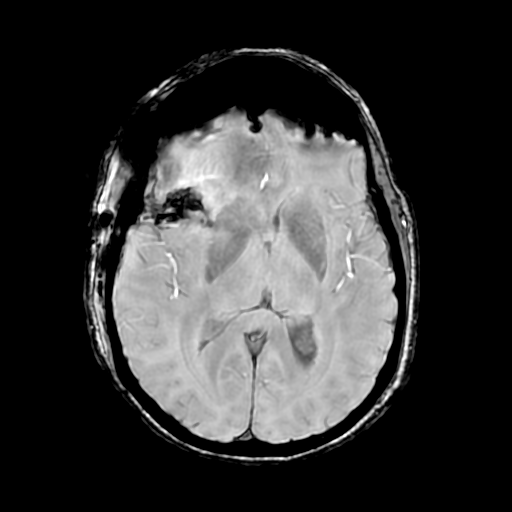
[im 67/100]
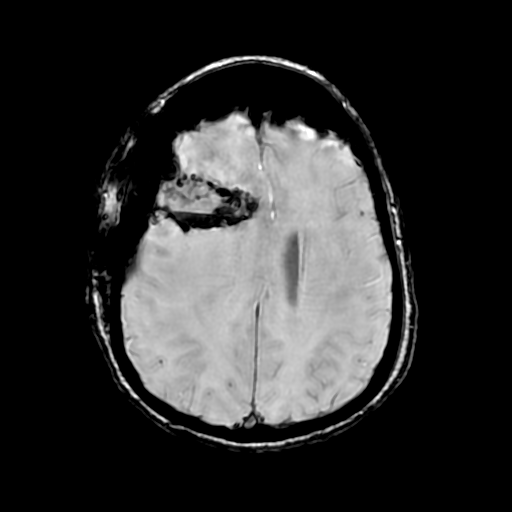

[Series 9: T2 post-contrast · coronal · 5.0mm · 0.39mm/px · 2 of 32 slices shown]
[im 1/32]
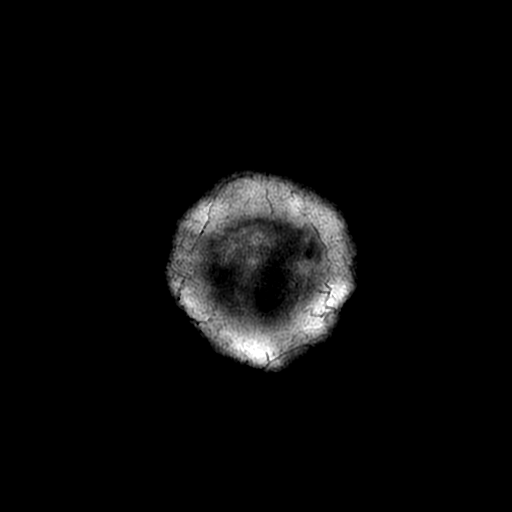
[im 32/32]
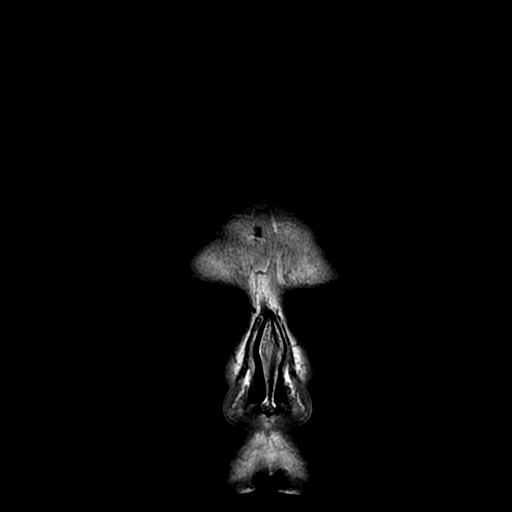

[Series 250: ADC · axial · 3.0mm · 0.94mm/px · z∈[-26,+120]mm · 3 of 52 slices shown (1 of 2)]
[im 1/52]
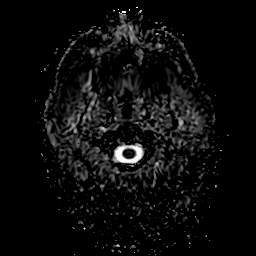
[im 26/52]
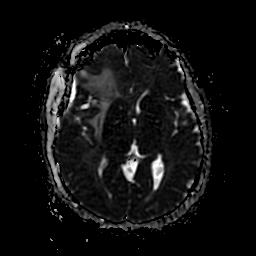
[im 52/52]
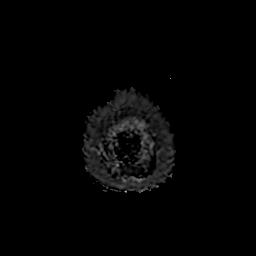

[Series 350: ADC · coronal · 4.0mm · 0.94mm/px · 2 of 36 slices shown (2 of 2)]
[im 1/36]
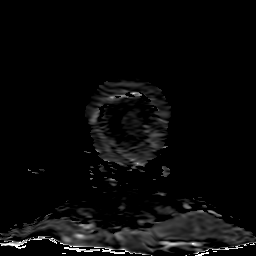
[im 36/36]
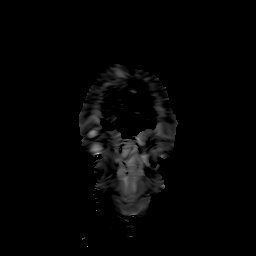

[29 of 48 positions shown; findings below may reference images not displayed]

FINDINGS: Brain: The patient is status post right frontal craniotomy.
Peripherally enhancing tumor has been resected. There is minimal
residual enhancement along the superior and lateral aspect of the
resection cavity. Blood products are present along the medial and
posterior margin of the cavity. There is some enhancement anterior
and superior is well. The bulk of the tumor has been resected.

A fluid level is present within the cavity.

Surrounding T2 signal changes are similar the prior study. Mass
effect is improved. Midline shift now measures 7 mm, compared with
10.5 mm on the prior study at the level of the mamillary bodies.

No new areas of enhancement are present.  There is no acute infarct.

No significant white matter disease is present in the left
hemisphere. Mass effect on the right lateral ventricle remains. The
brainstem and cerebellum are unremarkable.

Vascular: Flow is present in the major intracranial arteries.

Skull and upper cervical spine: The craniocervical junction is
normal. Upper cervical spine is within normal limits. Marrow signal
is unremarkable.

Sinuses/Orbits: The paranasal sinuses and mastoid air cells are
clear. The globes and orbits are within normal limits.
IMPRESSION: 1. Interval resection of tumor in the right frontal lobe with
minimal residual enhancement at the resection cavity, mostly
superior and posterior with some medial superior and anterior
enhancement.
2. Blood products in the resection cavity, as expected.
3. Similar appearance of the extent of T2 signal change within the
right hemisphere.
4. Decreased mass effect.
5. No acute infarct.

## 2022-06-06 DEATH — deceased
